# Patient Record
Sex: Female | Born: 1980
Health system: Southern US, Community
[De-identification: ages and names within clinical notes are randomized; demographics above are authoritative.]

## PROBLEM LIST (undated history)

## (undated) ENCOUNTER — Inpatient Hospital Stay (HOSPITAL_COMMUNITY): Payer: Self-pay

## (undated) DIAGNOSIS — Z8619 Personal history of other infectious and parasitic diseases: Secondary | ICD-10-CM

## (undated) DIAGNOSIS — G250 Essential tremor: Secondary | ICD-10-CM

## (undated) DIAGNOSIS — N920 Excessive and frequent menstruation with regular cycle: Secondary | ICD-10-CM

## (undated) DIAGNOSIS — B373 Candidiasis of vulva and vagina: Secondary | ICD-10-CM

## (undated) DIAGNOSIS — Z87440 Personal history of urinary (tract) infections: Secondary | ICD-10-CM

## (undated) DIAGNOSIS — D649 Anemia, unspecified: Secondary | ICD-10-CM

## (undated) DIAGNOSIS — L709 Acne, unspecified: Secondary | ICD-10-CM

## (undated) DIAGNOSIS — B3731 Acute candidiasis of vulva and vagina: Secondary | ICD-10-CM

## (undated) DIAGNOSIS — Z87898 Personal history of other specified conditions: Secondary | ICD-10-CM

## (undated) HISTORY — PX: COSMETIC SURGERY: SHX468

## (undated) HISTORY — DX: Acne, unspecified: L70.9

## (undated) HISTORY — DX: Personal history of urinary (tract) infections: Z87.440

## (undated) HISTORY — DX: Excessive and frequent menstruation with regular cycle: N92.0

## (undated) HISTORY — PX: LAPAROSCOPIC UNILATERAL SALPINGO OOPHERECTOMY: SHX5935

## (undated) HISTORY — DX: Acute candidiasis of vulva and vagina: B37.31

## (undated) HISTORY — PX: BREAST SURGERY: SHX581

## (undated) HISTORY — PX: APPENDECTOMY: SHX54

## (undated) HISTORY — PX: WISDOM TOOTH EXTRACTION: SHX21

## (undated) HISTORY — PX: DILATION AND EVACUATION: SHX1459

## (undated) HISTORY — DX: Candidiasis of vulva and vagina: B37.3

## (undated) HISTORY — DX: Anemia, unspecified: D64.9

## (undated) HISTORY — DX: Personal history of other infectious and parasitic diseases: Z86.19

## (undated) HISTORY — DX: Essential tremor: G25.0

## (undated) HISTORY — DX: Personal history of other specified conditions: Z87.898

---
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
## 2002-01-03 ENCOUNTER — Inpatient Hospital Stay (HOSPITAL_COMMUNITY): Admission: AD | Admit: 2002-01-03 | Discharge: 2002-01-03 | Payer: Self-pay | Admitting: *Deleted

## 2002-01-03 ENCOUNTER — Encounter: Payer: Self-pay | Admitting: Family Medicine

## 2002-02-09 ENCOUNTER — Encounter (INDEPENDENT_AMBULATORY_CARE_PROVIDER_SITE_OTHER): Payer: Self-pay

## 2002-02-09 ENCOUNTER — Inpatient Hospital Stay (HOSPITAL_COMMUNITY): Admission: AD | Admit: 2002-02-09 | Discharge: 2002-02-10 | Payer: Self-pay | Admitting: *Deleted

## 2002-05-23 ENCOUNTER — Inpatient Hospital Stay (HOSPITAL_COMMUNITY): Admission: AD | Admit: 2002-05-23 | Discharge: 2002-05-23 | Payer: Self-pay | Admitting: Family Medicine

## 2002-09-21 ENCOUNTER — Emergency Department (HOSPITAL_COMMUNITY): Admission: EM | Admit: 2002-09-21 | Discharge: 2002-09-21 | Payer: Self-pay

## 2002-09-22 ENCOUNTER — Emergency Department (HOSPITAL_COMMUNITY): Admission: EM | Admit: 2002-09-22 | Discharge: 2002-09-22 | Payer: Self-pay | Admitting: *Deleted

## 2003-10-30 ENCOUNTER — Inpatient Hospital Stay (HOSPITAL_COMMUNITY): Admission: AD | Admit: 2003-10-30 | Discharge: 2003-10-30 | Payer: Self-pay | Admitting: Obstetrics and Gynecology

## 2004-07-29 ENCOUNTER — Inpatient Hospital Stay (HOSPITAL_COMMUNITY): Admission: AD | Admit: 2004-07-29 | Discharge: 2004-07-30 | Payer: Self-pay | Admitting: Obstetrics & Gynecology

## 2005-01-14 ENCOUNTER — Inpatient Hospital Stay (HOSPITAL_COMMUNITY): Admission: AD | Admit: 2005-01-14 | Discharge: 2005-01-14 | Payer: Self-pay | Admitting: Obstetrics & Gynecology

## 2005-01-25 ENCOUNTER — Ambulatory Visit (HOSPITAL_COMMUNITY): Admission: RE | Admit: 2005-01-25 | Discharge: 2005-01-25 | Payer: Self-pay | Admitting: Obstetrics & Gynecology

## 2005-02-20 ENCOUNTER — Emergency Department (HOSPITAL_COMMUNITY): Admission: EM | Admit: 2005-02-20 | Discharge: 2005-02-20 | Payer: Self-pay | Admitting: Emergency Medicine

## 2005-05-24 ENCOUNTER — Emergency Department (HOSPITAL_COMMUNITY): Admission: EM | Admit: 2005-05-24 | Discharge: 2005-05-24 | Payer: Self-pay | Admitting: Family Medicine

## 2006-06-14 ENCOUNTER — Ambulatory Visit (HOSPITAL_COMMUNITY): Admission: RE | Admit: 2006-06-14 | Discharge: 2006-06-14 | Payer: Self-pay | Admitting: Obstetrics and Gynecology

## 2006-07-16 ENCOUNTER — Emergency Department (HOSPITAL_COMMUNITY): Admission: EM | Admit: 2006-07-16 | Discharge: 2006-07-17 | Payer: Self-pay | Admitting: Emergency Medicine

## 2007-06-09 ENCOUNTER — Inpatient Hospital Stay (HOSPITAL_COMMUNITY): Admission: AD | Admit: 2007-06-09 | Discharge: 2007-06-09 | Payer: Self-pay | Admitting: Obstetrics & Gynecology

## 2007-09-02 IMAGING — US US OB COMP LESS 14 WK
1 series · 13 of 28 positions shown · non-contrast
Comparison: none

CLINICAL DATA: 24-year-old female with positive pregnancy.  5 week EGA by LMP.  
 OBSTETRICAL ULTRASOUND <14 WKS AND TRANSVAGINAL OB US:
TECHNIQUE: Both transabdominal and transvaginal ultrasound examinations were performed for complete evaluation of the gestation as well as the maternal uterus, adnexal regions, and pelvic cul-de-sac.

[Series 1: us ob comp less 14 wk · 0.29mm/px · 13 of 63 slices shown]
[im 3/63]
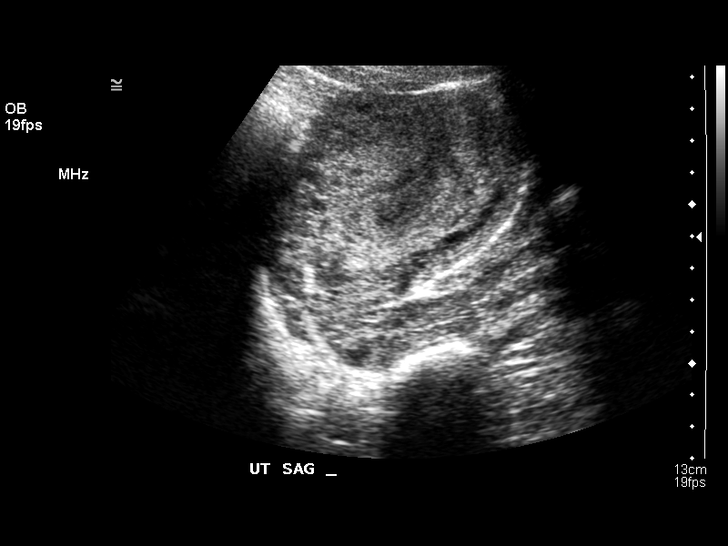
[im 7/63]
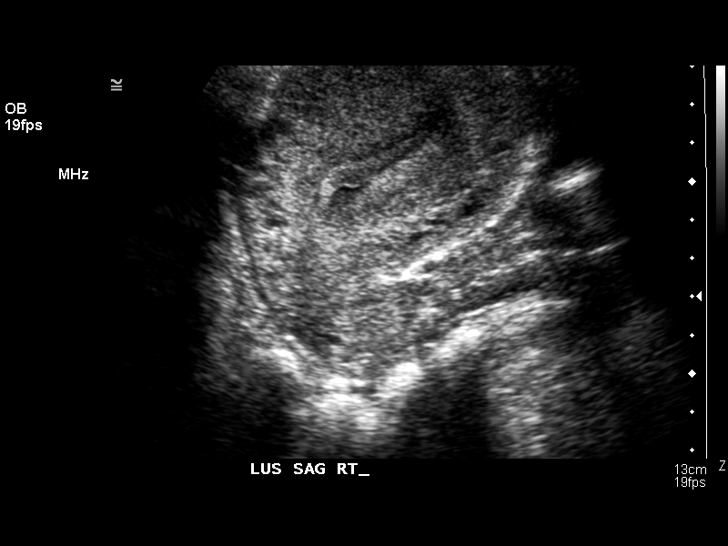
[im 12/63]
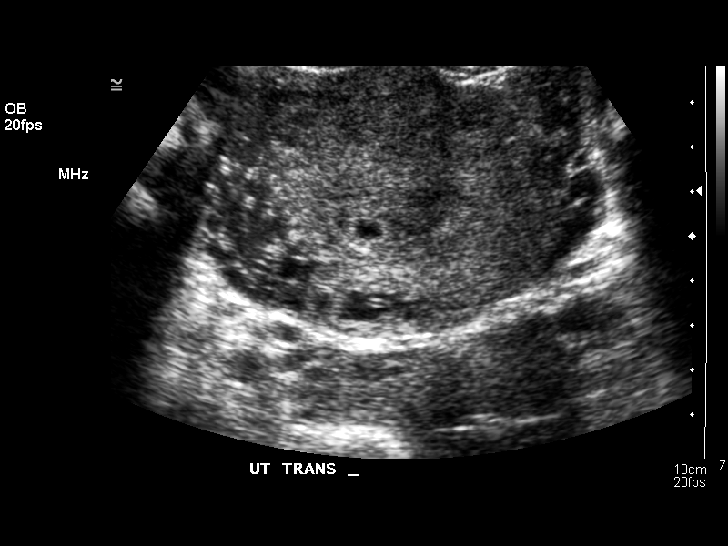
[im 17/63]
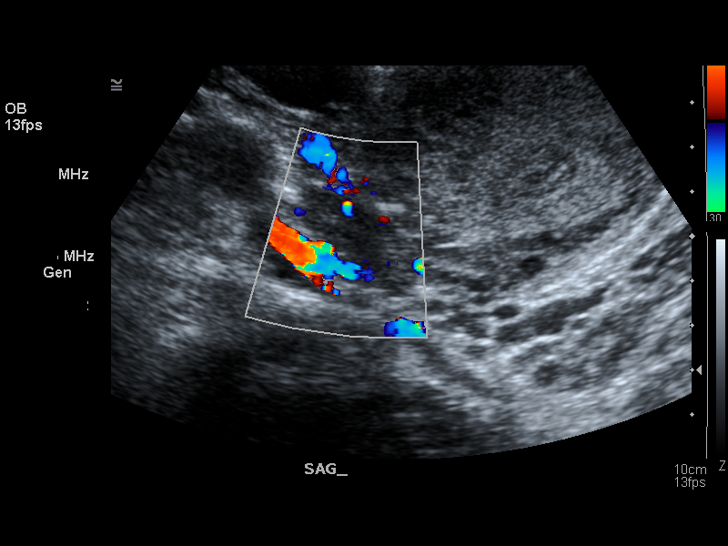
[im 21/63]
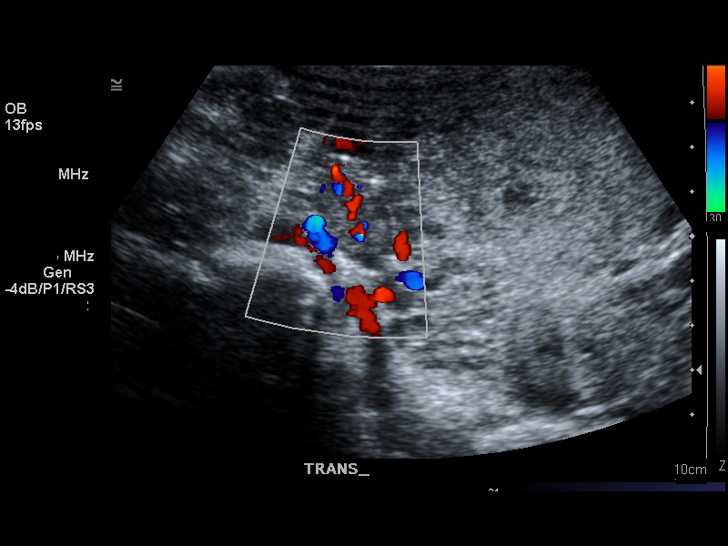
[im 26/63]
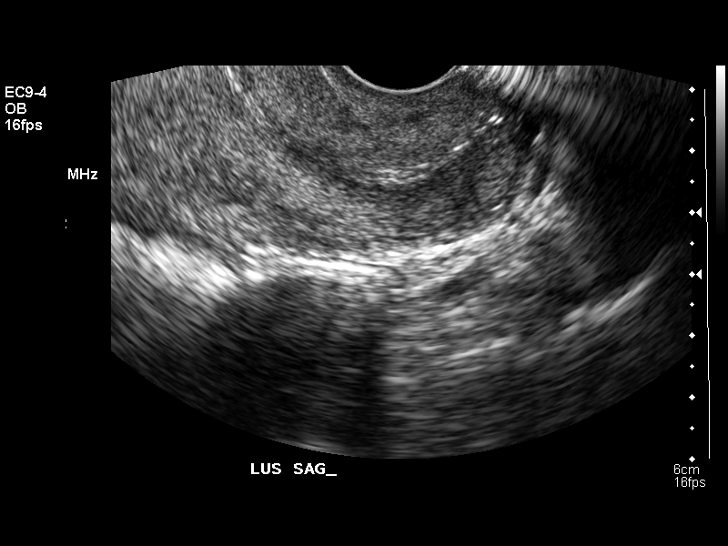
[im 33/63]
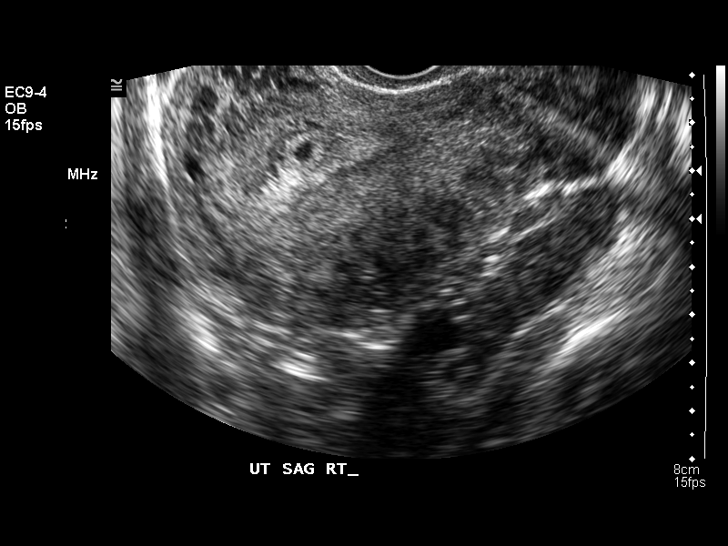
[im 37/63]
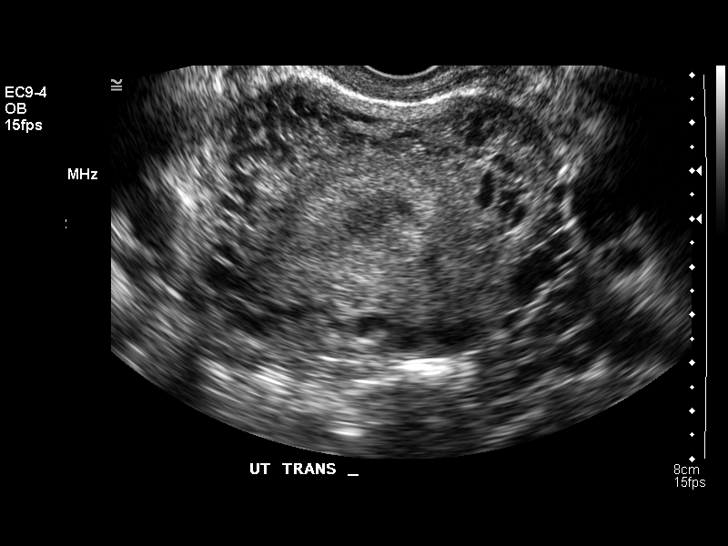
[im 42/63]
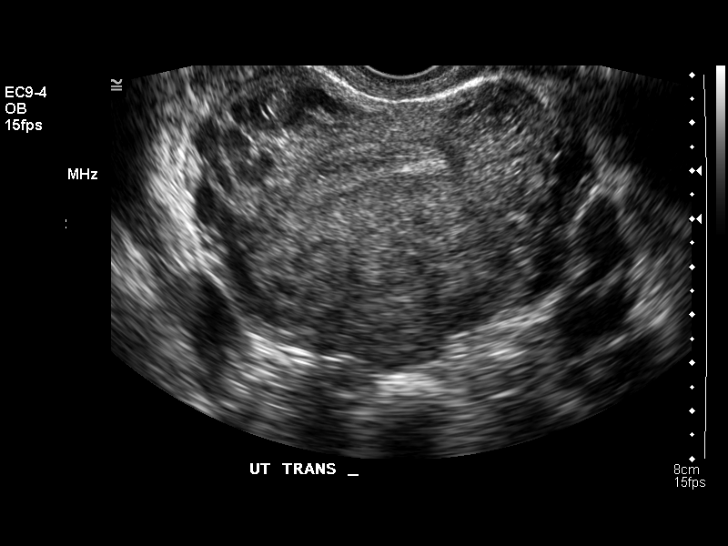
[im 46/63]
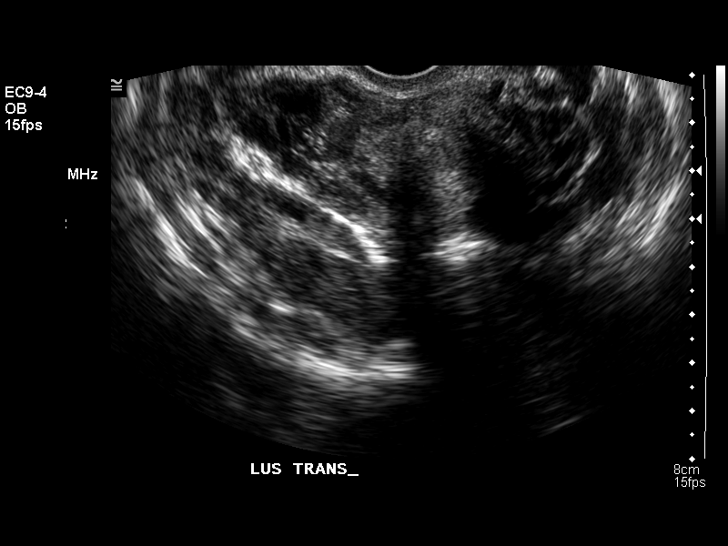
[im 51/63]
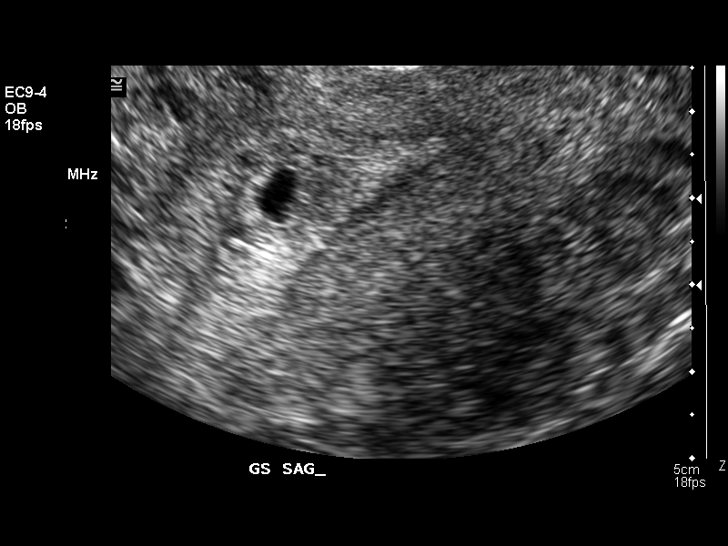
[im 56/63]
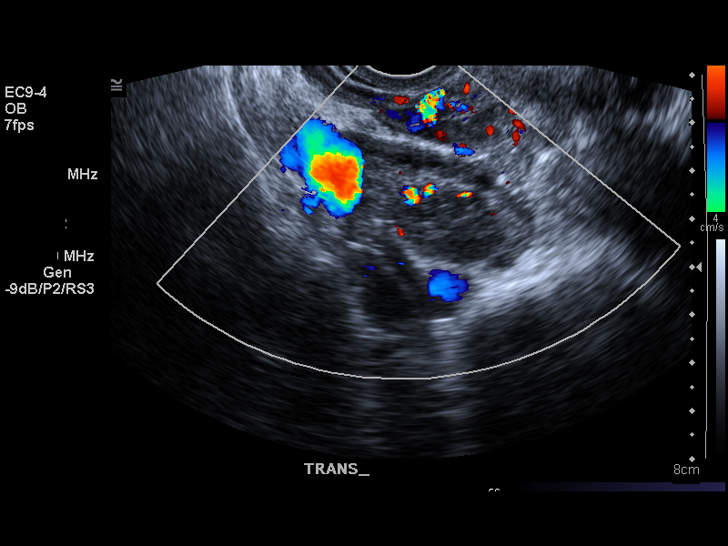
[im 60/63]
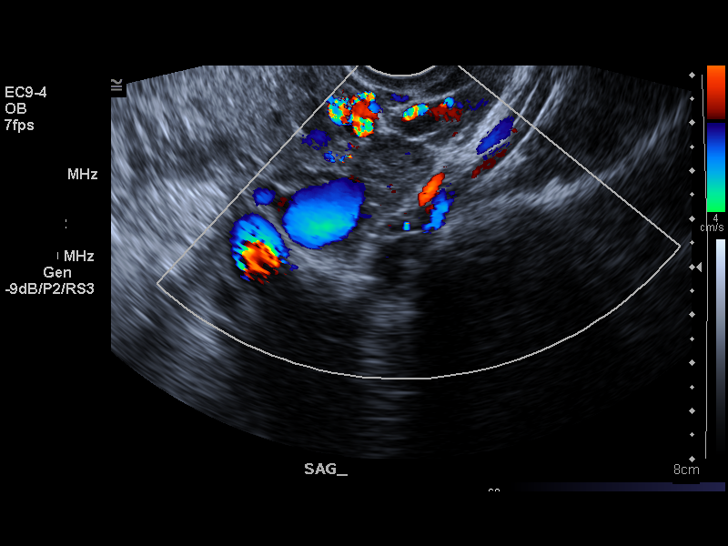

[13 of 28 positions shown; findings below may reference images not displayed]

FINDINGS: There is a single intrauterine cystic structure with mild surrounding echogenicity likely representing small gestational sac.  This mean diameter of this structure measures 4.8 mm corresponding to a gestational age of 5 weeks 2 days.  However, there is no evidence of yolk sac or fetal pole visualized at this time.  No evidence of chronic hemorrhage.  Left ovary is not visualized, but the right ovary is unremarkable.  There is no evidence of free fluid or adnexal masses.
IMPRESSION: 1.  Single intrauterine cystic structure likely representing a gestational sac, which would measures 5 weeks 2 days.  However, as there is no evidence of yolk sac or fetal pole at this time, cannot definitely confirm this is a gestational sac and recommend clinical or imaging follow-up as ectopic is not excluded.
 2.  No evidence of free fluid or adnexal masses.

## 2007-09-08 ENCOUNTER — Inpatient Hospital Stay (HOSPITAL_COMMUNITY): Admission: AD | Admit: 2007-09-08 | Discharge: 2007-09-08 | Payer: Self-pay | Admitting: Obstetrics and Gynecology

## 2007-09-20 ENCOUNTER — Emergency Department (HOSPITAL_COMMUNITY): Admission: EM | Admit: 2007-09-20 | Discharge: 2007-09-20 | Payer: Self-pay | Admitting: Family Medicine

## 2007-10-28 ENCOUNTER — Emergency Department (HOSPITAL_COMMUNITY): Admission: EM | Admit: 2007-10-28 | Discharge: 2007-10-28 | Payer: Self-pay | Admitting: Emergency Medicine

## 2008-02-18 ENCOUNTER — Inpatient Hospital Stay (HOSPITAL_COMMUNITY): Admission: AD | Admit: 2008-02-18 | Discharge: 2008-02-18 | Payer: Self-pay | Admitting: Obstetrics & Gynecology

## 2008-03-07 ENCOUNTER — Emergency Department (HOSPITAL_COMMUNITY): Admission: EM | Admit: 2008-03-07 | Discharge: 2008-03-07 | Payer: Self-pay | Admitting: Emergency Medicine

## 2008-09-03 ENCOUNTER — Encounter: Admission: RE | Admit: 2008-09-03 | Discharge: 2008-09-03 | Payer: Self-pay | Admitting: Internal Medicine

## 2009-11-20 ENCOUNTER — Emergency Department (HOSPITAL_COMMUNITY): Admission: EM | Admit: 2009-11-20 | Discharge: 2009-11-20 | Payer: Self-pay | Admitting: Family Medicine

## 2010-02-04 ENCOUNTER — Emergency Department (HOSPITAL_COMMUNITY)
Admission: EM | Admit: 2010-02-04 | Discharge: 2010-02-04 | Payer: Self-pay | Source: Home / Self Care | Admitting: Family Medicine

## 2010-03-28 ENCOUNTER — Inpatient Hospital Stay (INDEPENDENT_AMBULATORY_CARE_PROVIDER_SITE_OTHER)
Admission: RE | Admit: 2010-03-28 | Discharge: 2010-03-28 | Disposition: A | Discharge status: Home / Self Care | Payer: PRIVATE HEALTH INSURANCE | Source: Ambulatory Visit | Attending: Family Medicine | Admitting: Family Medicine

## 2010-03-28 DIAGNOSIS — I951 Orthostatic hypotension: Secondary | ICD-10-CM

## 2010-03-28 LAB — POCT URINALYSIS DIPSTICK
Nitrite: POSITIVE — AB
Protein, ur: >=300 mg/dL — AB
Urobilinogen, UA: 2 mg/dL — ABNORMAL HIGH (ref 0.0–1.0)
pH: 7 (ref 5.0–8.0)

## 2010-03-28 LAB — POCT I-STAT, CHEM 8
Calcium, Ion: 1.15 mmol/L (ref 1.12–1.32)
Chloride: 106 mEq/L (ref 96–112)
Glucose, Bld: 89 mg/dL (ref 70–99)
HCT: 41 % (ref 36.0–46.0)
TCO2: 22 mmol/L (ref 0–100)

## 2010-03-30 ENCOUNTER — Emergency Department (HOSPITAL_COMMUNITY): Payer: PRIVATE HEALTH INSURANCE

## 2010-03-30 ENCOUNTER — Emergency Department (HOSPITAL_COMMUNITY)
Admission: EM | Admit: 2010-03-30 | Discharge: 2010-03-30 | Disposition: A | Discharge status: Home / Self Care | Payer: PRIVATE HEALTH INSURANCE | Attending: Emergency Medicine | Admitting: Emergency Medicine

## 2010-03-30 DIAGNOSIS — R51 Headache: Secondary | ICD-10-CM | POA: Insufficient documentation

## 2010-03-30 DIAGNOSIS — R0602 Shortness of breath: Secondary | ICD-10-CM | POA: Insufficient documentation

## 2010-03-30 DIAGNOSIS — R42 Dizziness and giddiness: Secondary | ICD-10-CM | POA: Insufficient documentation

## 2010-03-30 LAB — URINALYSIS, ROUTINE W REFLEX MICROSCOPIC
Leukocytes, UA: NEGATIVE
Nitrite: NEGATIVE
Specific Gravity, Urine: 1.013 (ref 1.005–1.030)
pH: 7.5 (ref 5.0–8.0)

## 2010-03-30 LAB — DIFFERENTIAL
Basophils Absolute: 0 10*3/uL (ref 0.0–0.1)
Eosinophils Absolute: 0.1 10*3/uL (ref 0.0–0.7)
Eosinophils Relative: 1 % (ref 0–5)
Lymphocytes Relative: 20 % (ref 12–46)
Monocytes Absolute: 0.5 10*3/uL (ref 0.1–1.0)

## 2010-03-30 LAB — COMPREHENSIVE METABOLIC PANEL
CO2: 26 mEq/L (ref 19–32)
Calcium: 9.1 mg/dL (ref 8.4–10.5)
Creatinine, Ser: 0.8 mg/dL (ref 0.4–1.2)
GFR calc non Af Amer: >60 mL/min (ref >60)
Glucose, Bld: 86 mg/dL (ref 70–99)

## 2010-03-30 LAB — CBC
HCT: 38.5 % (ref 36.0–46.0)
MCHC: 34 g/dL (ref 30.0–36.0)
MCV: 94.1 fL (ref 78.0–100.0)
Platelets: 228 10*3/uL (ref 150–400)
RDW: 13.1 % (ref 11.5–15.5)

## 2010-03-30 LAB — URINE MICROSCOPIC-ADD ON

## 2010-05-02 LAB — POCT URINALYSIS DIPSTICK
Ketones, ur: NEGATIVE mg/dL
Nitrite: POSITIVE — AB
Protein, ur: 30 mg/dL — AB
pH: 6 (ref 5.0–8.0)

## 2010-05-02 LAB — URINE CULTURE: Colony Count: >=100000

## 2010-05-05 LAB — WET PREP, GENITAL: Yeast Wet Prep HPF POC: NONE SEEN

## 2010-05-05 LAB — POCT URINALYSIS DIPSTICK
Glucose, UA: NEGATIVE mg/dL
Specific Gravity, Urine: >=1.03 (ref 1.005–1.030)
Urobilinogen, UA: 1 mg/dL (ref 0.0–1.0)

## 2010-05-05 LAB — POCT PREGNANCY, URINE: Preg Test, Ur: NEGATIVE

## 2010-07-07 ENCOUNTER — Inpatient Hospital Stay (INDEPENDENT_AMBULATORY_CARE_PROVIDER_SITE_OTHER)
Admission: RE | Admit: 2010-07-07 | Discharge: 2010-07-07 | Disposition: A | Discharge status: Home / Self Care | Payer: PRIVATE HEALTH INSURANCE | Source: Ambulatory Visit | Attending: Family Medicine | Admitting: Family Medicine

## 2010-07-07 ENCOUNTER — Inpatient Hospital Stay (HOSPITAL_COMMUNITY)
Admission: RE | Admit: 2010-07-07 | Discharge: 2010-07-07 | Disposition: A | Discharge status: Home / Self Care | Payer: PRIVATE HEALTH INSURANCE | Source: Ambulatory Visit | Attending: Family Medicine | Admitting: Family Medicine

## 2010-07-07 DIAGNOSIS — B373 Candidiasis of vulva and vagina: Secondary | ICD-10-CM

## 2010-07-07 DIAGNOSIS — B3731 Acute candidiasis of vulva and vagina: Secondary | ICD-10-CM

## 2010-07-07 LAB — POCT URINALYSIS DIP (DEVICE)
Protein, ur: 30 mg/dL — AB
Specific Gravity, Urine: 1.025 (ref 1.005–1.030)
pH: 6 (ref 5.0–8.0)

## 2010-07-07 LAB — POCT PREGNANCY, URINE: Preg Test, Ur: NEGATIVE

## 2010-07-08 NOTE — Discharge Summary (Signed)
NAME:  Carolyn Pierce, Carolyn Pierce                         ACCOUNT NO.:  [id number]   MEDICAL RECORD NO.:  [id number]                   PATIENT TYPE:  INP   LOCATION:  9138                                 FACILITY:  [facility]   PHYSICIAN:  Conni Elliot, M.D.             DATE OF BIRTH:  [birth year]   DATE OF ADMISSION:  02/09/2002  DATE OF DISCHARGE:  02/10/2002                                 DISCHARGE SUMMARY   PROCEDURE:  None.   CONSULTS:  1. Social work.  2. Pastoral care.   DISCHARGE DIAGNOSES:  1. Intrauterine pregnancy at approximately [redacted] weeks gestation.  2. Fetal demise.  3. Preterm labor.  4. Preterm delivery.  5. Anemia, acute blood loss.  6. O- blood type.   DISCHARGE MEDICATIONS:  1. Ibuprofen 600 mg one p.o. q.6h. p.r.n. pain.  2. Ortho-Evra patch apply one patch to skin every week.  3. Flintstone vitamins one tablet q.d.  4. Iron sulfate 325 mg one p.o. b.i.d. x3 months.   FOLLOW UP:  The patient is recommended to follow up at [facility] in the  next six weeks.   HOSPITAL COURSE:  The patient is a 30 year old G4, P1-0-2-1 presenting at  approximately [redacted] weeks gestation with no prenatal care and presenting in  active labor.  The patient's cervix is completely dilated with a bulging bag  of membranes and infant was determined to be breech presentation by  ultrasound.  Secondary to advanced dilatation and preterm labor, infant was  delivered spontaneously by spontaneous vaginal delivery.  Delivery of a female  infant with Apgars 1 at one minute, 1 at five minutes, and 1 at 10 minutes.  NICU team was present at delivery.  Infant was initially intubated, however,  heart rate fell to 50s despite resuscitation measures.  Infant was unable to  be resuscitated by the NICU team present.  Infant was pronounced dead,  wrapped in warm blankets, and handed to mother and father.  The patient did  well in the postoperative period without any complications.  The patient met  with  pastoral care as well as social work during her hospitalization in  regards to funeral arrangements as well as appropriate grieving process.  The patient was provided with contact information and pamphlets in regards  to grieving and reactions to loss of such.  The patient was also counseled  extensively to future grieving potential physical changes including milk  production in the upcoming days.  The patient received RhoGAM prior to  discharge secondary to O- status and unknown infant blood type.  The patient  desired [facility]inks for contraception.   DISCHARGE LABORATORIES:  Kauer blood cells 12.5, hemoglobin 7.8, hematocrit  23.0, platelets 285,000.  RPR is nonreactive.   DISCHARGE INSTRUCTIONS:  Activity:  No restrictions.  Diet:  Routine.  Instructions:  The patient is to return to [facility] for development  of fever, severe abdominal pain, vomiting, or  abdominal distention.     Nilda Simmer, M.D.                        Conni Elliot, M.D.    KS/MEDQ  D:  02/10/2002  T:  02/11/2002  Job:  [id number]

## 2010-11-15 LAB — GC/CHLAMYDIA PROBE AMP, GENITAL
Chlamydia, DNA Probe: NEGATIVE
GC Probe Amp, Genital: NEGATIVE

## 2010-11-15 LAB — WET PREP, GENITAL: Clue Cells Wet Prep HPF POC: NONE SEEN

## 2010-11-18 LAB — WET PREP, GENITAL: Yeast Wet Prep HPF POC: NONE SEEN

## 2010-11-18 LAB — GC/CHLAMYDIA PROBE AMP, GENITAL: GC Probe Amp, Genital: NEGATIVE

## 2011-04-23 ENCOUNTER — Encounter (HOSPITAL_COMMUNITY): Payer: Self-pay | Admitting: *Deleted

## 2011-04-23 ENCOUNTER — Emergency Department (HOSPITAL_COMMUNITY)
Admission: EM | Admit: 2011-04-23 | Discharge: 2011-04-23 | Disposition: A | Discharge status: Home / Self Care | Payer: Medicaid Other | Attending: Emergency Medicine | Admitting: Emergency Medicine

## 2011-04-23 DIAGNOSIS — K5289 Other specified noninfective gastroenteritis and colitis: Secondary | ICD-10-CM | POA: Insufficient documentation

## 2011-04-23 DIAGNOSIS — K529 Noninfective gastroenteritis and colitis, unspecified: Secondary | ICD-10-CM

## 2011-04-23 DIAGNOSIS — IMO0001 Reserved for inherently not codable concepts without codable children: Secondary | ICD-10-CM | POA: Insufficient documentation

## 2011-04-23 DIAGNOSIS — R000 Tachycardia, unspecified: Secondary | ICD-10-CM | POA: Insufficient documentation

## 2011-04-23 DIAGNOSIS — A599 Trichomoniasis, unspecified: Secondary | ICD-10-CM

## 2011-04-23 DIAGNOSIS — R197 Diarrhea, unspecified: Secondary | ICD-10-CM | POA: Insufficient documentation

## 2011-04-23 DIAGNOSIS — R1110 Vomiting, unspecified: Secondary | ICD-10-CM | POA: Insufficient documentation

## 2011-04-23 LAB — URINALYSIS, ROUTINE W REFLEX MICROSCOPIC
Bilirubin Urine: NEGATIVE
Hgb urine dipstick: NEGATIVE
Ketones, ur: NEGATIVE mg/dL
Protein, ur: 30 mg/dL — AB
Urobilinogen, UA: 1 mg/dL (ref 0.0–1.0)

## 2011-04-23 LAB — URINE MICROSCOPIC-ADD ON

## 2011-04-23 LAB — POCT I-STAT, CHEM 8
Chloride: 103 mEq/L (ref 96–112)
HCT: 44 % (ref 36.0–46.0)
Hemoglobin: 15 g/dL (ref 12.0–15.0)
Potassium: 4.1 mEq/L (ref 3.5–5.1)
Sodium: 139 mEq/L (ref 135–145)

## 2011-04-23 LAB — POCT PREGNANCY, URINE: Preg Test, Ur: NEGATIVE

## 2011-04-23 MED ORDER — SODIUM CHLORIDE 0.9 % IV SOLN
Freq: Once | INTRAVENOUS | Status: AC
  Administered 2011-04-23: 10:00:00 via INTRAVENOUS

## 2011-04-23 MED ORDER — SODIUM CHLORIDE 0.9 % IV BOLUS (SEPSIS)
1000.0000 mL | Freq: Once | INTRAVENOUS | Status: AC
  Administered 2011-04-23: 1000 mL via INTRAVENOUS

## 2011-04-23 MED ORDER — METRONIDAZOLE 500 MG PO TABS: 500.0000 mg | ORAL_TABLET | Freq: Two times a day (BID) | ORAL | Status: AC

## 2011-04-23 NOTE — ED Notes (Signed)
Pt from home with reports of body aches, vomiting and diarrhea that started on Thursday, improved but returned this morning at 0500.

## 2011-04-23 NOTE — ED Provider Notes (Signed)
History     CSN: [id number]  Arrival date & time 04/23/11  0814   First MD Initiated Contact with Patient 04/23/11 0831      Chief Complaint  Patient presents with  . Generalized Body Aches  . Emesis  . Diarrhea    (Consider location/radiation/quality/duration/timing/severity/associated sxs/prior treatment) Patient is a 31 y.o. female presenting with vomiting and diarrhea. The history is provided by the patient.  Emesis  This is a new problem. The current episode started more than 2 days ago. The problem occurs 2 to 4 times per day. The problem has not changed since onset.There has been no fever. Associated symptoms include diarrhea. Pertinent negatives include no abdominal pain, no chills, no cough, no fever, no headaches and no URI.  Diarrhea The primary symptoms include vomiting and diarrhea. Primary symptoms do not include fever or abdominal pain.  The illness does not include chills.   No meds take pta--nothing makes sx better or worse History reviewed. No pertinent past medical history.  Past Surgical History  Procedure Date  . Appendectomy     History reviewed. No pertinent family history.  History  Substance Use Topics  . Smoking status: Never Smoker   . Smokeless tobacco: Never Used  . Alcohol Use: Yes     occ    OB History    Grav Para Term Preterm Abortions TAB SAB Ect Mult Living                  Review of Systems  Constitutional: Negative for fever and chills.  Respiratory: Negative for cough.   Gastrointestinal: Positive for vomiting and diarrhea. Negative for abdominal pain.  Neurological: Negative for headaches.  All other systems reviewed and are negative.    Allergies  Review of patient's allergies indicates no known allergies.  Home Medications   Current Outpatient Rx  Name Route Sig Dispense Refill  . FERROUS SULFATE 325 (65 FE) MG PO TABS Oral Take 325 mg by mouth daily with breakfast.    . NORGESTIM-ETH ESTRAD TRIPHASIC  0.18/0.215/0.25 MG-25 MCG PO TABS Oral Take 1 tablet by mouth daily.    Marland Kitchen PROPRANOLOL HCL 20 MG PO TABS Oral Take 20 mg by mouth daily.      BP 115/71  Pulse 110  Temp(Src) 98.7 F (37.1 C) (Oral)  Resp 18  Wt 120 lb (54.432 kg)  SpO2 100%  LMP 04/10/2011  Physical Exam  Nursing note and vitals reviewed. Constitutional: She is oriented to person, place, and time. She appears well-developed and well-nourished.  Non-toxic appearance. No distress.  HENT:  Head: Normocephalic and atraumatic.  Eyes: Conjunctivae, EOM and lids are normal. Pupils are equal, round, and reactive to light.  Neck: Normal range of motion. Neck supple. No tracheal deviation present. No mass present.  Cardiovascular: Regular rhythm and normal heart sounds.  Tachycardia present.  Exam reveals no gallop.   No murmur heard. Pulmonary/Chest: Effort normal and breath sounds normal. No stridor. No respiratory distress. She has no decreased breath sounds. She has no wheezes. She has no rhonchi. She has no rales.  Abdominal: Soft. Normal appearance and bowel sounds are normal. She exhibits no distension. There is no tenderness. There is no rigidity, no rebound, no guarding and no CVA tenderness.  Musculoskeletal: Normal range of motion. She exhibits no edema and no tenderness.  Neurological: She is alert and oriented to person, place, and time. She has normal strength. No cranial nerve deficit or sensory deficit. GCS eye subscore is 4.  GCS verbal subscore is 5. GCS motor subscore is 6.  Skin: Skin is warm and dry. No abrasion and no rash noted.  Psychiatric: She has a normal mood and affect. Her speech is normal and behavior is normal.    ED Course  Procedures (including critical care time)  Labs Reviewed - No data to display No results found.   No diagnosis found.    MDM  Patient given IV fluids and anti-medics and feels better. We'll treat patient for her Trichomonas        Toy Baker, MD 04/23/11  1039

## 2011-04-24 LAB — URINE CULTURE

## 2011-10-30 ENCOUNTER — Emergency Department (HOSPITAL_COMMUNITY)
Admission: EM | Admit: 2011-10-30 | Discharge: 2011-10-30 | Disposition: A | Discharge status: Home / Self Care | Payer: Medicaid Other | Source: Home / Self Care | Attending: Family Medicine | Admitting: Family Medicine

## 2011-10-30 ENCOUNTER — Encounter (HOSPITAL_COMMUNITY): Payer: Self-pay | Admitting: Emergency Medicine

## 2011-10-30 DIAGNOSIS — A499 Bacterial infection, unspecified: Secondary | ICD-10-CM

## 2011-10-30 DIAGNOSIS — N760 Acute vaginitis: Secondary | ICD-10-CM

## 2011-10-30 DIAGNOSIS — B9689 Other specified bacterial agents as the cause of diseases classified elsewhere: Secondary | ICD-10-CM

## 2011-10-30 LAB — POCT URINALYSIS DIP (DEVICE)
Leukocytes, UA: NEGATIVE
Protein, ur: NEGATIVE mg/dL
Urobilinogen, UA: 2 mg/dL — ABNORMAL HIGH (ref 0.0–1.0)
pH: 6 (ref 5.0–8.0)

## 2011-10-30 LAB — WET PREP, GENITAL

## 2011-10-30 LAB — POCT PREGNANCY, URINE: Preg Test, Ur: NEGATIVE

## 2011-10-30 MED ORDER — METRONIDAZOLE 0.75 % VA GEL: 1.0000 | VAGINAL | Status: AC

## 2011-10-30 NOTE — ED Notes (Signed)
Pt having increased thick Cornman ordorous discharge for 3 days. States she has no pain or itching, no frequency or dysuria.

## 2011-10-30 NOTE — ED Provider Notes (Signed)
History     CSN: [id number]  Arrival date & time 10/30/11  1740   First MD Initiated Contact with Patient 10/30/11 1744      Chief Complaint  Patient presents with  . Vaginal Discharge    (Consider location/radiation/quality/duration/timing/severity/associated sxs/prior treatment) Patient is a 31 y.o. female presenting with vaginal discharge. The history is provided by the patient.  Vaginal Discharge This is a new problem. The current episode started more than 2 days ago. The problem has not changed since onset.Associated symptoms comments: Vaginal odor is primary issue.    History reviewed. No pertinent past medical history.  Past Surgical History  Procedure Date  . Appendectomy     History reviewed. No pertinent family history.  History  Substance Use Topics  . Smoking status: Never Smoker   . Smokeless tobacco: Never Used  . Alcohol Use: Yes     occ    OB History    Grav Para Term Preterm Abortions TAB SAB Ect Mult Living                  Review of Systems  Constitutional: Negative.   Gastrointestinal: Negative.   Genitourinary: Positive for vaginal discharge. Negative for vaginal bleeding and vaginal pain.  Musculoskeletal: Negative.     Allergies  Review of patient's allergies indicates no known allergies.  Home Medications   Current Outpatient Rx  Name Route Sig Dispense Refill  . FERROUS SULFATE 325 (65 FE) MG PO TABS Oral Take 325 mg by mouth daily with breakfast.    . METRONIDAZOLE 0.75 % VA GEL Vaginal Place 1 Applicatorful vaginally 1 day or 1 dose. Use nightly for 5-7 nights 70 g 0  . NORGESTIM-ETH ESTRAD TRIPHASIC 0.18/0.215/0.25 MG-25 MCG PO TABS Oral Take 1 tablet by mouth daily.    Marland Kitchen PROPRANOLOL HCL 20 MG PO TABS Oral Take 20 mg by mouth daily.      BP 114/75  Pulse 70  Temp 99 F (37.2 C) (Oral)  Resp 18  SpO2 100%  LMP 10/16/2011  Physical Exam  Nursing note and vitals reviewed. Constitutional: She is oriented to person, place,  and time. She appears well-developed and well-nourished.  Abdominal: Soft. Bowel sounds are normal. She exhibits no mass. There is no tenderness.  Genitourinary: Uterus normal. No erythema, tenderness or bleeding around the vagina. No foreign body around the vagina. Vaginal discharge found.  Neurological: She is alert and oriented to person, place, and time.  Skin: Skin is warm and dry.    ED Course  Procedures (including critical care time)  Labs Reviewed  POCT URINALYSIS DIP (DEVICE) - Abnormal; Notable for the following:    Hgb urine dipstick TRACE (*)     Urobilinogen, UA 2.0 (*)     All other components within normal limits  POCT PREGNANCY, URINE  WET PREP, GENITAL  GC/CHLAMYDIA PROBE AMP, GENITAL   No results found.   1. Bacterial vaginosis       MDM          Linna Hoff, MD 10/30/11 1905

## 2011-10-31 LAB — GC/CHLAMYDIA PROBE AMP, GENITAL: Chlamydia, DNA Probe: NEGATIVE

## 2011-10-31 NOTE — ED Notes (Signed)
Gc/Chlamydia neg., Wet prep: mod. clue cells, few WBC's.  Pt. adequately treated with Metrogel. Carolyn Pierce 10/31/2011

## 2011-12-28 ENCOUNTER — Telehealth: Payer: Self-pay | Admitting: Family Medicine

## 2012-01-03 NOTE — Telephone Encounter (Signed)
FYI

## 2012-01-04 ENCOUNTER — Encounter: Payer: Self-pay | Admitting: Medical

## 2012-01-04 ENCOUNTER — Ambulatory Visit (INDEPENDENT_AMBULATORY_CARE_PROVIDER_SITE_OTHER): Payer: 59 | Admitting: Medical

## 2012-01-04 VITALS — BP 100/80 | HR 80 | Temp 98.5°F | Resp 16 | Ht 63.0 in | Wt 129.0 lb

## 2012-01-04 DIAGNOSIS — D649 Anemia, unspecified: Secondary | ICD-10-CM

## 2012-01-04 DIAGNOSIS — L708 Other acne: Secondary | ICD-10-CM

## 2012-01-04 DIAGNOSIS — Z8742 Personal history of other diseases of the female genital tract: Secondary | ICD-10-CM

## 2012-01-04 DIAGNOSIS — L709 Acne, unspecified: Secondary | ICD-10-CM

## 2012-01-04 DIAGNOSIS — Z87898 Personal history of other specified conditions: Secondary | ICD-10-CM

## 2012-01-04 DIAGNOSIS — Z0000 Encounter for general adult medical examination without abnormal findings: Secondary | ICD-10-CM

## 2012-01-04 LAB — POCT URINALYSIS DIPSTICK
Bilirubin, UA: NEGATIVE
Nitrite, UA: NEGATIVE
Urobilinogen, UA: NEGATIVE
pH, UA: 6

## 2012-01-04 MED ORDER — CLINDAMYCIN PHOS-BENZOYL PEROX 1-5 % EX GEL: Freq: Two times a day (BID) | CUTANEOUS | Status: DC

## 2012-01-04 NOTE — Progress Notes (Signed)
Subjective:   HPI  Carolyn Pierce is a 31 y.o. female who presents for a complete physical.  New patient today.     Preventative care: Last ophthalmology visit:n/a Last dental visit:yes, recently Last colonoscopy:n/a Last mammogram: years ago normal, done due to aching in breast Last gynecological exam:? 2013 including pap Last EKG:n/a Last labs:08/2011  Prior vaccinations: TD or Tdap:3 to 4 years ago Influenza:yes at work Pneumococcal:n/a Shingles/Zostavax:n/a  Advanced directive:n/a Health care power of attorney:n/a Living will:n/a  Concerns: Acne of face, upper back, upper chest, mild to moderate at times.   Hx/o heavy periods, hx/o iron deficiency anemia.    Hx/o recurrent yeast infections, but this has improved since begging probiotic.  Reviewed their medical, surgical, family, social, medication, and allergy history and updated chart as appropriate.   Past Medical History  Diagnosis Date  . Acne   . Essential tremor     Dr. Terrace Arabia, neurology  . History of abnormal Pap smear   . History of UTI   . History of trichomonal vaginitis   . Menorrhagia     prior trials of Depo Provera, Ortho Tri Cyclen Lo  . Anemia     hx/o iron deficiency anemia  . Yeast vaginitis     recurrent    Past Surgical History  Procedure Date  . Appendectomy   . Wisdom tooth extraction     History reviewed. No pertinent family history.  History   Social History  . Marital Status: Single    Spouse Name: N/A    Number of Children: N/A  . Years of Education: N/A   Occupational History  . Not on file.   Social History Main Topics  . Smoking status: Never Smoker   . Smokeless tobacco: Never Used  . Alcohol Use: Yes     Comment: occ  . Drug Use: No  . Sexually Active: Yes    Birth Control/ Protection: Condom   Other Topics Concern  . Not on file   Social History Narrative   Single, son 21 years old, psychiatric nurse, exercises 3-4 days per week, 90 minutes with  running, weights    Current Outpatient Prescriptions on File Prior to Visit  Medication Sig Dispense Refill  . propranolol (INDERAL) 20 MG tablet Take 20 mg by mouth daily.        No Known Allergies   Review of Systems Constitutional: -fever, -chills, -sweats, -unexpected weight change, -decreased appetite, +fatigue Allergy: -sneezing, -itching, -congestion Dermatology: -changing moles, --rash, -lumps ENT: -runny nose, -ear pain, -sore throat, -hoarseness, -sinus pain, -teeth pain, - ringing in ears, -hearing loss, -nosebleeds Cardiology: -chest pain, -palpitations, -swelling, -difficulty breathing when lying flat, -waking up short of breath Respiratory: -cough, -shortness of breath, -difficulty breathing with exercise or exertion, -wheezing, -coughing up blood Gastroenterology: -abdominal pain, -nausea, -vomiting, -diarrhea, -constipation, -blood in stool, -changes in bowel movement, -difficulty swallowing or eating Hematology: -bleeding, -bruising  Musculoskeletal: -joint aches, -muscle aches, -joint swelling, -back pain, -neck pain, -cramping, -changes in gait Ophthalmology: denies vision changes, eye redness, itching, discharge Urology: -burning with urination, -difficulty urinating, -blood in urine, +urinary frequency, -urgency, -incontinence Neurology: -headache, -weakness, -tingling, -numbness, -memory loss, -falls, -dizziness Psychology: -depressed mood, -agitation, -sleep problems     Objective:   Physical Exam  Filed Vitals:   01/04/12 1435  Height: 5\' 3"  (1.6 m)  Weight: 129 lb (58.514 kg)    General appearance: alert, no distress, WD/WN, lean AA female Skin: mild to moderate acne of face and upper  chest HEENT: normocephalic, conjunctiva/corneas normal, sclerae anicteric, PERRLA, EOMi, nares patent, no discharge or erythema, pharynx normal Oral cavity: MMM, tongue normal, teeth in good repair Neck: supple, no lymphadenopathy, no thyromegaly, no masses, normal  ROM Chest: non tender, normal shape and expansion Heart: RRR, normal S1, S2, no murmurs Lungs: CTA bilaterally, no wheezes, rhonchi, or rales Abdomen: +bs, soft, RLQ surgical scar, striae of abdomen, tattoo of umbilicus, non tender, non distended, no masses, no hepatomegaly, no splenomegaly, no bruits Back: non tender, normal ROM, no scoliosis Musculoskeletal: upper extremities non tender, no obvious deformity, normal ROM throughout, lower extremities non tender, no obvious deformity, normal ROM throughout Extremities: no edema, no cyanosis, no clubbing Pulses: 2+ symmetric, upper and lower extremities, normal cap refill Neurological: alert, oriented x 3, CN2-12 intact, strength normal upper extremities and lower extremities, sensation normal throughout, DTRs 2+ throughout, no cerebellar signs, gait normal Psychiatric: normal affect, behavior normal, pleasant  Breast/gyn/rectal - deferred    Assessment and Plan :    Encounter Diagnoses  Name Primary?  . Routine general medical examination at a health care facility Yes  . Acne   . Anemia   . History of abnormal Pap smear     Physical exam - discussed healthy lifestyle, diet, exercise, preventative care, vaccinations, and addressed their concerns.  Handout given.  Acne - begin benzaclin, daily face wash, recheck 64mo.  Advised daily MV with iron.  We will request prior pap and lab records.  No labs today.   Follow-up pending labs

## 2012-01-05 ENCOUNTER — Encounter: Payer: Self-pay | Admitting: Medical

## 2012-01-08 ENCOUNTER — Encounter: Payer: Self-pay | Admitting: Medical

## 2012-01-25 ENCOUNTER — Telehealth: Payer: Self-pay | Admitting: Internal Medicine

## 2012-01-26 NOTE — Telephone Encounter (Signed)
pls pull chart to see if we ever got prior pap records. I'll need an updated negative pregnancy test too.

## 2012-01-30 NOTE — Telephone Encounter (Signed)
We don't have any old records on this patient. CLS

## 2012-01-30 NOTE — Telephone Encounter (Signed)
Patient's chart is on your desk. CLS 

## 2012-01-30 NOTE — Telephone Encounter (Signed)
pls pull this chart again.  i accidentally put it back.  Look to see if we got pap results in from former doctor

## 2012-01-30 NOTE — Telephone Encounter (Signed)
So far I haven't received prior records, labs, pap.  Make sure we sent request for records.

## 2012-02-01 ENCOUNTER — Telehealth: Payer: Self-pay | Admitting: Medical

## 2012-02-02 NOTE — Telephone Encounter (Signed)
1) she apparently didn't sign records release.  Pls have her come in to sign records release request so I cant get last pap results.  I dont' have them 2) when she comes have her do urine pregnancy.  If negative, then I'll send the OCP.   Is she currently taking them?  If not, when was last pill?  What was the name of the one she was using?

## 2012-02-09 NOTE — Telephone Encounter (Signed)
I have tried to get in touch with this patient but no return phone yet. CLS

## 2012-08-22 ENCOUNTER — Encounter (HOSPITAL_COMMUNITY): Payer: Self-pay | Admitting: Emergency Medicine

## 2012-08-22 ENCOUNTER — Emergency Department (HOSPITAL_COMMUNITY)
Admission: EM | Admit: 2012-08-22 | Discharge: 2012-08-22 | Disposition: A | Discharge status: Home / Self Care | Payer: 59 | Source: Home / Self Care

## 2012-08-22 DIAGNOSIS — M67919 Unspecified disorder of synovium and tendon, unspecified shoulder: Secondary | ICD-10-CM

## 2012-08-22 DIAGNOSIS — M7551 Bursitis of right shoulder: Secondary | ICD-10-CM

## 2012-08-22 LAB — POCT URINALYSIS DIP (DEVICE)
Leukocytes, UA: NEGATIVE
Nitrite: NEGATIVE
Protein, ur: NEGATIVE mg/dL
Urobilinogen, UA: 0.2 mg/dL (ref 0.0–1.0)
pH: 6.5 (ref 5.0–8.0)

## 2012-08-22 LAB — POCT PREGNANCY, URINE: Preg Test, Ur: NEGATIVE

## 2012-08-22 MED ORDER — TRIAMCINOLONE ACETONIDE 40 MG/ML IJ SUSP
INTRAMUSCULAR | Status: AC
  Filled 2012-08-22: qty 5

## 2012-08-22 NOTE — ED Provider Notes (Signed)
Medical screening examination/treatment/procedure(s) were performed by non-physician practitioner and as supervising physician I was immediately available for consultation/collaboration.  Leslee Home, M.D.  Reuben Likes, MD 08/22/12 [phone]

## 2012-08-22 NOTE — ED Provider Notes (Signed)
History    CSN: [id number] Arrival date & time 08/22/12  1117  None    Chief Complaint  Patient presents with  . Shoulder Pain   (Consider location/radiation/quality/duration/timing/severity/associated sxs/prior Treatment) HPI Comments: 32 year old female presents with pain in the right upper arm to proximal deltoid. She is complaining of pain with abduction. She has been having the pain for a couple months is gradually getting worse. She is unaware of any known trauma. No known injury. She does work at a computer quite a bit. Patient is also complaining of occasional urinary frequency and sensation of the need to urinate again after urination but without the ability to release additional urine. There is no dysuria no pelvic or suprapubic pain.   Patient is a 32 y.o. female presenting with shoulder pain.  Shoulder Pain   Past Medical History  Diagnosis Date  . Acne   . Essential tremor     Dr. Terrace Arabia, neurology  . History of abnormal Pap smear   . History of UTI   . History of trichomonal vaginitis   . Menorrhagia     prior trials of Depo Provera, Ortho Tri Cyclen Lo  . Anemia     hx/o iron deficiency anemia  . Yeast vaginitis     recurrent   Past Surgical History  Procedure Laterality Date  . Appendectomy    . Wisdom tooth extraction     No family history on file. History  Substance Use Topics  . Smoking status: Never Smoker   . Smokeless tobacco: Never Used  . Alcohol Use: Yes     Comment: occ   OB History   Grav Para Term Preterm Abortions TAB SAB Ect Mult Living                 Review of Systems  Constitutional: Negative for fever, chills and activity change.  HENT: Negative.   Respiratory: Negative.   Cardiovascular: Negative.   Musculoskeletal:       As per HPI  Skin: Negative for color change, pallor and rash.  Neurological: Negative.     Allergies  Review of patient's allergies indicates no known allergies.  Home Medications   Current  Outpatient Rx  Name  Route  Sig  Dispense  Refill  . propranolol (INDERAL) 20 MG tablet   Oral   Take 20 mg by mouth daily.         . clindamycin-benzoyl peroxide (BENZACLIN) gel   Topical   Apply topically 2 (two) times daily.   25 g   0    BP 104/70  Pulse 74  Temp(Src) 99 F (37.2 C) (Oral)  Resp 16  SpO2 99%  LMP 08/09/2012 Physical Exam  Nursing note and vitals reviewed. Constitutional: She is oriented to person, place, and time. She appears well-developed and well-nourished. No distress.  HENT:  Head: Normocephalic and atraumatic.  Eyes: EOM are normal. Pupils are equal, round, and reactive to light.  Neck: Normal range of motion. Neck supple.  Pulmonary/Chest: Effort normal.  Musculoskeletal:  Area pain and tenderness difficult to locate with palpation when the arm is hanging by the side. Upon abduction beyond 90 there is tenderness over the most proximal aspect of the deltoid ligament. This point is just distal to the a.c. joint. There is no swelling, deformity or discoloration. She is able to abduct to approximately 140.  Lymphadenopathy:    She has no cervical adenopathy.  Neurological: She is alert and oriented to person, place, and time.  No cranial nerve deficit.  Skin: Skin is warm and dry.  Psychiatric: She has a normal mood and affect.    ED Course  Injection tendon or ligament Date/Time: 08/22/2012 1:00 PM Performed by: Phineas Real, Hien Perreira Authorized by: Phineas Real, Jamieon Lannen Consent: Verbal consent obtained. Risks and benefits: risks, benefits and alternatives were discussed Consent given by: patient Patient understanding: patient states understanding of the procedure being performed Patient identity confirmed: verbally with patient Local anesthesia used: yes Anesthesia: local infiltration Local anesthetic: bupivacaine 0.25% with epinephrine Anesthetic total: 2.5 ml Patient sedated: no Patient tolerance: Patient tolerated the procedure well with no immediate  complications. Comments: The trigger point was located in the mid proximal deltoid just inferior to superior glenoid. A total of 2.5 cc of Kenalog and 2.5 cc of Marcaine was injected into the site at various angles.   (including critical care time) Labs Reviewed  POCT URINALYSIS DIP (DEVICE) - Abnormal; Notable for the following:    Hgb urine dipstick MODERATE (*)    All other components within normal limits  POCT PREGNANCY, URINE   No results found. 1. Bursitis/tendonitis, shoulder, right     MDM  Injected trigger point the right performed over the proximal deltoid tendon with Kenalog and Marcaine. She may apply heat today to help with Circulation of the medication but may want to change to ice the following day. Instructions for bursitis, deltoid ligament pain and muscles of the rotator cuff. For continuation of pain and pain with abduction followup with orthopedist. Urinalysis is negative. Patient is instructed that if she develops dysuria, digital frequency, urgency or other problems may return.   Hayden Rasmussen, NP 08/22/12 1317

## 2012-08-22 NOTE — ED Notes (Signed)
Pt c/o right arm/shoulder pain onset x6 weeks... Pain started on the arm and it worked its way up to right shoulder... Denies: inj/trauma, strenuous activity... Pain increases w/activity... Also c/o poss UTI onset 1 week... sxs include: urinary freq/urgency... Denies: fevers, vag d/c, abd pain, dysuria... She is alert w/no signs of acute distress.

## 2012-12-03 ENCOUNTER — Telehealth: Payer: Self-pay | Admitting: *Deleted

## 2012-12-03 NOTE — Telephone Encounter (Signed)
Left message for patient to call the office

## 2012-12-20 ENCOUNTER — Ambulatory Visit: Payer: Self-pay | Admitting: Neurology

## 2012-12-26 ENCOUNTER — Ambulatory Visit (INDEPENDENT_AMBULATORY_CARE_PROVIDER_SITE_OTHER): Payer: 59 | Admitting: Neurology

## 2012-12-26 ENCOUNTER — Encounter: Payer: Self-pay | Admitting: Neurology

## 2012-12-26 VITALS — BP 132/71 | HR 82 | Ht 61.0 in | Wt 132.0 lb

## 2012-12-26 DIAGNOSIS — R259 Unspecified abnormal involuntary movements: Secondary | ICD-10-CM

## 2012-12-26 DIAGNOSIS — R251 Tremor, unspecified: Secondary | ICD-10-CM | POA: Insufficient documentation

## 2012-12-26 DIAGNOSIS — F419 Anxiety disorder, unspecified: Secondary | ICD-10-CM | POA: Insufficient documentation

## 2012-12-26 DIAGNOSIS — F411 Generalized anxiety disorder: Secondary | ICD-10-CM

## 2012-12-26 MED ORDER — PROPRANOLOL HCL 20 MG PO TABS: 20.0000 mg | ORAL_TABLET | Freq: Every day | ORAL | Status: DC

## 2012-12-26 MED ORDER — BUSPIRONE HCL 5 MG PO TABS: 5.0000 mg | ORAL_TABLET | Freq: Three times a day (TID) | ORAL | Status: DC

## 2012-12-26 NOTE — Progress Notes (Signed)
[facility]  PATIENT: Carolyn Pierce DOB: 12/18/1980  HISTORICAL:  Elease Hashimoto is a 32 years old right-handed African American female, she is referred by her primary care physician for evaluation of tremor, last clinical visit was in July 2012,  She has a strong family history of tremor, her father, her sister, even her 24 years old son has bilateral hands tremor,  She noticed bilateral hands tremor since middle school, most noticeable when she writing, holding a piece of paper, but never cause any functional limitations, she graduated from nursing school, currently working as a Loss adjuster, chartered, she noticed her hand tremor when she perform injection, causing mild difficulty, getting worse when she was anxious, she suffered a mild anxiety disorder as well, she is taking propanolol 20 mg twice a day, which helps her tremor, she also wants a prescription of Buspirone for her anxiety  REVIEW OF SYSTEMS: Full 14 system review of systems performed and notable only for tremor, anxiety ALLERGIES: No Known Allergies  HOME MEDICATIONS: Outpatient Prescriptions Prior to Visit  Medication Sig Dispense Refill  . clindamycin-benzoyl peroxide (BENZACLIN) gel Apply topically 2 (two) times daily.  25 g  0  . propranolol (INDERAL) 20 MG tablet Take 20 mg by mouth daily.       No facility-administered medications prior to visit.    PAST MEDICAL HISTORY: Past Medical History  Diagnosis Date  . Acne   . Essential tremor     Dr. Terrace Arabia, neurology  . History of abnormal Pap smear   . History of UTI   . History of trichomonal vaginitis   . Menorrhagia     prior trials of Depo Provera, Ortho Tri Cyclen Lo  . Anemia     hx/o iron deficiency anemia  . Yeast vaginitis     recurrent    PAST SURGICAL HISTORY: Past Surgical History  Procedure Laterality Date  . Appendectomy    . Wisdom tooth extraction      FAMILY HISTORY: History reviewed. No pertinent family  history.  SOCIAL HISTORY:  History   Social History  . Marital Status: Single    Spouse Name: N/A    Number of Children: N/A  . Years of Education: N/A   Occupational History  . Not on file.   Social History Main Topics  . Smoking status: Never Smoker   . Smokeless tobacco: Never Used  . Alcohol Use: Yes     Comment: occ  . Drug Use: No  . Sexual Activity: Yes    Birth Control/ Protection: Condom   Other Topics Concern  . Not on file   Social History Narrative   Single, son 24 years old, psychiatric nurse, exercises 3-4 days per week, 90 minutes with running, weights     PHYSICAL EXAM   Filed Vitals:   12/26/12 1108  BP: 132/71  Pulse: 82  Height: 5\' 1"  (1.549 m)  Weight: 132 lb (59.875 kg)   Body mass index is 24.95 kg/(m^2).   Generalized: In no acute distress  Neck: Supple, no carotid bruits   Cardiac: Regular rate rhythm  Pulmonary: Clear to auscultation bilaterally  Musculoskeletal: No deformity  Neurological examination  Mentation: Alert oriented to time, place, history taking, and causual conversation  Cranial nerve II-XII: Pupils were equal round reactive to light extraocular movements were full, visual field were full on confrontational test. facial sensation and strength were normal. hearing was intact to finger rubbing bilaterally. Uvula tongue midline.  head turning and shoulder  shrug and were normal and symmetric.Tongue protrusion into cheek strength was normal.  Motor: normal tone, bulk and strength, with exception of mild bilateral hand postural tremor, no rigidity  Sensory: Intact to fine touch, pinprick, preserved vibratory sensation, and proprioception at toes.  Coordination: Normal finger to nose, heel-to-shin bilaterally there was no truncal ataxia  Gait: Rising up from seated position without assistance, normal stance, without trunk ataxia, moderate stride, good arm swing, smooth turning, able to perform tiptoe, and heel walking  without difficulty.   Romberg signs: Negative  Deep tendon reflexes: Brachioradialis 2/2, biceps 2/2, triceps 2/2, patellar 2/2, Achilles 2/2, plantar responses were flexor bilaterally.   DIAGNOSTIC DATA (LABS, IMAGING, TESTING) - I reviewed patient records, labs, notes, testing and imaging myself where available.  Lab Results  Component Value Date   WBC 7.5 03/30/2010   HGB 15.0 04/23/2011   HCT 44.0 04/23/2011   MCV 94.1 03/30/2010   PLT 228 03/30/2010      Component Value Date/Time   NA 139 04/23/2011 0910   K 4.1 04/23/2011 0910   CL 103 04/23/2011 0910   CO2 26 03/30/2010 1334   GLUCOSE 91 04/23/2011 0910   BUN 16 04/23/2011 0910   CREATININE 0.70 04/23/2011 0910   CALCIUM 9.1 03/30/2010 1334   PROT 7.1 03/30/2010 1334   ALBUMIN 3.7 03/30/2010 1334   AST 18 03/30/2010 1334   ALT 12 03/30/2010 1334   ALKPHOS 34* 03/30/2010 1334   BILITOT 1.1 03/30/2010 1334   GFRNONAA >60 03/30/2010 1334   GFRAA  Value: >60        The eGFR has been calculated using the MDRD equation. This calculation has not been validated in all clinical situations. eGFR's persistently <60 mL/min signify possible Chronic Kidney Disease. 03/30/2010 1334    ASSESSMENT AND PLAN   32 years old right-handed African American female, presenting with bilateral hands tremor, she has strong family history of tremor, responding well to propranolol  1. I will refill her propranolol 20 mg twice a day, 2. She also complains of anxiety, will add on Buspirone 5mg  qday. 3. RTC in one year. Check TSH   Levert Feinstein, M.D. Ph.D.  [facility] [address] [place], [place] [address] [phone]

## 2012-12-27 LAB — THYROID PANEL WITH TSH
Free Thyroxine Index: 2.3 (ref 1.2–4.9)
TSH: 1.33 u[IU]/mL (ref 0.450–4.500)

## 2012-12-30 NOTE — Progress Notes (Signed)
Quick Note:  Spoke to patient and relayed normal labs, per Dr. Yan. ______ 

## 2013-10-01 ENCOUNTER — Emergency Department (HOSPITAL_COMMUNITY)
Admission: EM | Admit: 2013-10-01 | Discharge: 2013-10-01 | Discharge status: Against Medical Advice | Payer: Medicaid Other | Source: Home / Self Care | Attending: Emergency Medicine | Admitting: Emergency Medicine

## 2013-10-01 ENCOUNTER — Encounter (HOSPITAL_COMMUNITY): Payer: Self-pay | Admitting: Emergency Medicine

## 2013-10-01 DIAGNOSIS — R209 Unspecified disturbances of skin sensation: Secondary | ICD-10-CM | POA: Insufficient documentation

## 2013-10-01 DIAGNOSIS — R109 Unspecified abdominal pain: Secondary | ICD-10-CM | POA: Insufficient documentation

## 2013-10-01 NOTE — ED Notes (Signed)
Pt had a D & E done today and now having severe pain and spotting, pt currently yelling in room in pain.

## 2013-10-01 NOTE — ED Notes (Signed)
Triage done from downtime form 

## 2013-10-01 NOTE — ED Notes (Signed)
Pt walking out AMA, when RN came out of another room pt and boyfriend were walking out.

## 2013-10-01 NOTE — ED Notes (Signed)
Pt yelling in room, stating "this is not normal, I need to see someone now, my leg is numb", pt standing on side of bed w/o difficulty, pt states having severe pain.

## 2013-10-04 ENCOUNTER — Inpatient Hospital Stay (HOSPITAL_COMMUNITY): Payer: Medicaid Other | Admitting: Anesthesiology

## 2013-10-04 ENCOUNTER — Inpatient Hospital Stay (HOSPITAL_COMMUNITY): Payer: Medicaid Other

## 2013-10-04 ENCOUNTER — Encounter (HOSPITAL_COMMUNITY): Payer: Self-pay | Admitting: Emergency Medicine

## 2013-10-04 ENCOUNTER — Encounter (HOSPITAL_COMMUNITY): Payer: Self-pay | Admitting: *Deleted

## 2013-10-04 ENCOUNTER — Encounter (HOSPITAL_COMMUNITY): Payer: Medicaid Other | Admitting: Anesthesiology

## 2013-10-04 ENCOUNTER — Emergency Department (INDEPENDENT_AMBULATORY_CARE_PROVIDER_SITE_OTHER)
Admission: EM | Admit: 2013-10-04 | Discharge: 2013-10-04 | Disposition: A | Discharge status: Nursing Home (Includes ICF) | Payer: Medicaid Other | Source: Home / Self Care | Attending: Family Medicine | Admitting: Family Medicine

## 2013-10-04 ENCOUNTER — Encounter (HOSPITAL_COMMUNITY)
Admission: AD | Disposition: A | Discharge status: Home / Self Care | Payer: Self-pay | Source: Ambulatory Visit | Attending: Obstetrics and Gynecology

## 2013-10-04 ENCOUNTER — Ambulatory Visit (HOSPITAL_COMMUNITY)
Admit: 2013-10-04 | Discharge: 2013-10-04 | Disposition: A | Discharge status: Home / Self Care | Payer: Medicaid Other | Attending: Obstetrics and Gynecology | Admitting: Obstetrics and Gynecology

## 2013-10-04 ENCOUNTER — Inpatient Hospital Stay (HOSPITAL_COMMUNITY)
Admission: AD | Admit: 2013-10-04 | Discharge: 2013-10-05 | DRG: 743 | Disposition: A | Discharge status: Home / Self Care | Payer: Medicaid Other | Source: Ambulatory Visit | Attending: Obstetrics and Gynecology | Admitting: Obstetrics and Gynecology

## 2013-10-04 DIAGNOSIS — D72829 Elevated white blood cell count, unspecified: Secondary | ICD-10-CM | POA: Diagnosis present

## 2013-10-04 DIAGNOSIS — N8353 Torsion of ovary, ovarian pedicle and fallopian tube: Principal | ICD-10-CM | POA: Diagnosis present

## 2013-10-04 DIAGNOSIS — N83519 Torsion of ovary and ovarian pedicle, unspecified side: Secondary | ICD-10-CM | POA: Diagnosis present

## 2013-10-04 DIAGNOSIS — N739 Female pelvic inflammatory disease, unspecified: Secondary | ICD-10-CM | POA: Diagnosis present

## 2013-10-04 DIAGNOSIS — Z5331 Laparoscopic surgical procedure converted to open procedure: Secondary | ICD-10-CM | POA: Diagnosis not present

## 2013-10-04 DIAGNOSIS — R109 Unspecified abdominal pain: Secondary | ICD-10-CM

## 2013-10-04 DIAGNOSIS — D649 Anemia, unspecified: Secondary | ICD-10-CM | POA: Diagnosis present

## 2013-10-04 DIAGNOSIS — N949 Unspecified condition associated with female genital organs and menstrual cycle: Secondary | ICD-10-CM | POA: Diagnosis present

## 2013-10-04 HISTORY — PX: LAPAROSCOPY: SHX197

## 2013-10-04 LAB — URINE MICROSCOPIC-ADD ON

## 2013-10-04 LAB — COMPREHENSIVE METABOLIC PANEL
ALBUMIN: 4 g/dL (ref 3.5–5.2)
ALK PHOS: 76 U/L (ref 39–117)
ALT: 8 U/L (ref 0–35)
AST: 17 U/L (ref 0–37)
Anion gap: 16 — ABNORMAL HIGH (ref 5–15)
BILIRUBIN TOTAL: 1.1 mg/dL (ref 0.3–1.2)
BUN: 33 mg/dL — ABNORMAL HIGH (ref 6–23)
CHLORIDE: 94 meq/L — AB (ref 96–112)
CO2: 26 meq/L (ref 19–32)
CREATININE: 1.26 mg/dL — AB (ref 0.50–1.10)
Calcium: 9.5 mg/dL (ref 8.4–10.5)
GFR calc Af Amer: 65 mL/min — ABNORMAL LOW (ref >90)
GFR, EST NON AFRICAN AMERICAN: 56 mL/min — AB (ref >90)
Glucose, Bld: 130 mg/dL — ABNORMAL HIGH (ref 70–99)
POTASSIUM: 3.6 meq/L — AB (ref 3.7–5.3)
SODIUM: 136 meq/L — AB (ref 137–147)
Total Protein: 8.1 g/dL (ref 6.0–8.3)

## 2013-10-04 LAB — HCG, QUANTITATIVE, PREGNANCY: HCG, BETA CHAIN, QUANT, S: 6684 m[IU]/mL — AB (ref <5)

## 2013-10-04 LAB — CBC
HCT: 31.2 % — ABNORMAL LOW (ref 36.0–46.0)
Hemoglobin: 10.5 g/dL — ABNORMAL LOW (ref 12.0–15.0)
MCH: 28.2 pg (ref 26.0–34.0)
MCHC: 33.7 g/dL (ref 30.0–36.0)
MCV: 83.6 fL (ref 78.0–100.0)
PLATELETS: 415 10*3/uL — AB (ref 150–400)
RBC: 3.73 MIL/uL — AB (ref 3.87–5.11)
RDW: 15.4 % (ref 11.5–15.5)
WBC: 30.2 10*3/uL — AB (ref 4.0–10.5)

## 2013-10-04 LAB — DIFFERENTIAL
BASOS ABS: 0 10*3/uL (ref 0.0–0.1)
BASOS PCT: 0 % (ref 0–1)
EOS PCT: 0 % (ref 0–5)
Eosinophils Absolute: 0 10*3/uL (ref 0.0–0.7)
LYMPHS PCT: 5 % — AB (ref 12–46)
Lymphs Abs: 1.4 10*3/uL (ref 0.7–4.0)
MONO ABS: 2.7 10*3/uL — AB (ref 0.1–1.0)
Monocytes Relative: 9 % (ref 3–12)
NEUTROS ABS: 25.2 10*3/uL — AB (ref 1.7–7.7)
Neutrophils Relative %: 86 % — ABNORMAL HIGH (ref 43–77)

## 2013-10-04 LAB — URINALYSIS, ROUTINE W REFLEX MICROSCOPIC
Glucose, UA: NEGATIVE mg/dL
Ketones, ur: 15 mg/dL — AB
LEUKOCYTES UA: NEGATIVE
NITRITE: NEGATIVE
PROTEIN: 100 mg/dL — AB
UROBILINOGEN UA: 1 mg/dL (ref 0.0–1.0)
pH: 6 (ref 5.0–8.0)

## 2013-10-04 LAB — MRSA PCR SCREENING: MRSA BY PCR: NEGATIVE

## 2013-10-04 SURGERY — LAPAROSCOPY OPERATIVE
Anesthesia: General | Site: Abdomen

## 2013-10-04 MED ORDER — PIPERACILLIN-TAZOBACTAM 3.375 G IVPB 30 MIN
3.3750 g | Freq: Three times a day (TID) | INTRAVENOUS | Status: DC
  Administered 2013-10-04: 3.375 g via INTRAVENOUS
  Filled 2013-10-04 (×2): qty 50

## 2013-10-04 MED ORDER — MIDAZOLAM HCL 5 MG/5ML IJ SOLN
INTRAMUSCULAR | Status: DC | PRN
  Administered 2013-10-04 (×2): 1 mg via INTRAVENOUS

## 2013-10-04 MED ORDER — BUPIVACAINE HCL (PF) 0.25 % IJ SOLN
INTRAMUSCULAR | Status: AC
  Filled 2013-10-04: qty 30

## 2013-10-04 MED ORDER — LIDOCAINE HCL (CARDIAC) 20 MG/ML IV SOLN
INTRAVENOUS | Status: AC
  Filled 2013-10-04: qty 5

## 2013-10-04 MED ORDER — LACTATED RINGERS IV SOLN
INTRAVENOUS | Status: DC
  Administered 2013-10-04 – 2013-10-05 (×4): via INTRAVENOUS

## 2013-10-04 MED ORDER — PROMETHAZINE HCL 25 MG/ML IJ SOLN
12.5000 mg | Freq: Once | INTRAMUSCULAR | Status: AC
  Administered 2013-10-04: 12.5 mg via INTRAVENOUS
  Filled 2013-10-04: qty 1

## 2013-10-04 MED ORDER — PROPOFOL INFUSION 10 MG/ML OPTIME
INTRAVENOUS | Status: DC | PRN
  Administered 2013-10-04: 50 mL via INTRAVENOUS
  Administered 2013-10-04: 20 mL via INTRAVENOUS
  Administered 2013-10-04: 130 mL via INTRAVENOUS

## 2013-10-04 MED ORDER — FENTANYL CITRATE 0.05 MG/ML IJ SOLN
INTRAMUSCULAR | Status: AC
  Filled 2013-10-04: qty 5

## 2013-10-04 MED ORDER — ONDANSETRON HCL 4 MG/2ML IJ SOLN: 4.0000 mg | Freq: Four times a day (QID) | INTRAMUSCULAR | Status: DC | PRN

## 2013-10-04 MED ORDER — PROMETHAZINE HCL 25 MG/ML IJ SOLN
12.5000 mg | Freq: Once | INTRAMUSCULAR | Status: AC
  Administered 2013-10-04: 14:00:00 via INTRAVENOUS
  Filled 2013-10-04: qty 1

## 2013-10-04 MED ORDER — PRENATAL MULTIVITAMIN CH
1.0000 | ORAL_TABLET | Freq: Every day | ORAL | Status: DC
  Filled 2013-10-04: qty 1

## 2013-10-04 MED ORDER — PROPOFOL 10 MG/ML IV EMUL
INTRAVENOUS | Status: AC
  Filled 2013-10-04: qty 20

## 2013-10-04 MED ORDER — ONDANSETRON HCL 4 MG/2ML IJ SOLN
INTRAMUSCULAR | Status: DC | PRN
  Administered 2013-10-04: 4 mg via INTRAVENOUS

## 2013-10-04 MED ORDER — ROCURONIUM BROMIDE 100 MG/10ML IV SOLN
INTRAVENOUS | Status: AC
  Filled 2013-10-04: qty 1

## 2013-10-04 MED ORDER — LIDOCAINE HCL (CARDIAC) 20 MG/ML IV SOLN
INTRAVENOUS | Status: DC | PRN
  Administered 2013-10-04: 60 mg via INTRAVENOUS
  Administered 2013-10-04: 40 mg via INTRAVENOUS

## 2013-10-04 MED ORDER — HYDROMORPHONE HCL PF 1 MG/ML IJ SOLN
0.2000 mg | INTRAMUSCULAR | Status: DC | PRN
  Administered 2013-10-04: 0.6 mg via INTRAVENOUS
  Filled 2013-10-04: qty 1

## 2013-10-04 MED ORDER — SUCCINYLCHOLINE CHLORIDE 20 MG/ML IJ SOLN
INTRAMUSCULAR | Status: AC
  Filled 2013-10-04: qty 10

## 2013-10-04 MED ORDER — ROCURONIUM BROMIDE 100 MG/10ML IV SOLN
INTRAVENOUS | Status: DC | PRN
  Administered 2013-10-04: 5 mg via INTRAVENOUS
  Administered 2013-10-04: 30 mg via INTRAVENOUS

## 2013-10-04 MED ORDER — DEXAMETHASONE SODIUM PHOSPHATE 4 MG/ML IJ SOLN
INTRAMUSCULAR | Status: DC | PRN
  Administered 2013-10-04: 10 mg via INTRAVENOUS

## 2013-10-04 MED ORDER — GADOBENATE DIMEGLUMINE 529 MG/ML IV SOLN: 10.0000 mL | Freq: Once | INTRAVENOUS | Status: AC | PRN

## 2013-10-04 MED ORDER — FENTANYL CITRATE 0.05 MG/ML IJ SOLN
INTRAMUSCULAR | Status: AC
  Filled 2013-10-04: qty 2

## 2013-10-04 MED ORDER — IOHEXOL 300 MG/ML  SOLN
100.0000 mL | Freq: Once | INTRAMUSCULAR | Status: AC | PRN
  Administered 2013-10-04: 100 mL via INTRAVENOUS

## 2013-10-04 MED ORDER — BUPIVACAINE HCL (PF) 0.25 % IJ SOLN
INTRAMUSCULAR | Status: DC | PRN
  Administered 2013-10-04: 7 mL

## 2013-10-04 MED ORDER — MIDAZOLAM HCL 2 MG/2ML IJ SOLN
INTRAMUSCULAR | Status: AC
  Filled 2013-10-04: qty 2

## 2013-10-04 MED ORDER — FENTANYL CITRATE 0.05 MG/ML IJ SOLN
INTRAMUSCULAR | Status: DC | PRN
  Administered 2013-10-04 (×2): 50 ug via INTRAVENOUS
  Administered 2013-10-04: 100 ug via INTRAVENOUS
  Administered 2013-10-04: 150 ug via INTRAVENOUS

## 2013-10-04 MED ORDER — ONDANSETRON HCL 4 MG PO TABS: 4.0000 mg | ORAL_TABLET | Freq: Four times a day (QID) | ORAL | Status: DC | PRN

## 2013-10-04 MED ORDER — ONDANSETRON HCL 4 MG/2ML IJ SOLN
INTRAMUSCULAR | Status: AC
  Filled 2013-10-04: qty 2

## 2013-10-04 MED ORDER — CEFAZOLIN SODIUM-DEXTROSE 2-3 GM-% IV SOLR
INTRAVENOUS | Status: DC | PRN
  Administered 2013-10-04: 2 g via INTRAVENOUS

## 2013-10-04 MED ORDER — LACTATED RINGERS IV BOLUS (SEPSIS)
1000.0000 mL | Freq: Once | INTRAVENOUS | Status: AC
  Administered 2013-10-04: 1000 mL via INTRAVENOUS

## 2013-10-04 MED ORDER — IOHEXOL 300 MG/ML  SOLN
50.0000 mL | INTRAMUSCULAR | Status: AC
  Administered 2013-10-04 (×2): 50 mL via ORAL

## 2013-10-04 MED ORDER — DEXAMETHASONE SODIUM PHOSPHATE 10 MG/ML IJ SOLN
INTRAMUSCULAR | Status: AC
  Filled 2013-10-04: qty 1

## 2013-10-04 SURGICAL SUPPLY — 38 items
ADH SKN CLS APL DERMABOND .7 (GAUZE/BANDAGES/DRESSINGS) ×1
BAG SPEC RTRVL LRG 6X4 10 (ENDOMECHANICALS)
BLADE SURG 11 STRL SS (BLADE) ×3 IMPLANT
CHLORAPREP W/TINT 26ML (MISCELLANEOUS) ×3 IMPLANT
DERMABOND ADVANCED (GAUZE/BANDAGES/DRESSINGS) ×2
DERMABOND ADVANCED .7 DNX12 (GAUZE/BANDAGES/DRESSINGS) ×1 IMPLANT
DRSG COVADERM PLUS 2X2 (GAUZE/BANDAGES/DRESSINGS) ×6 IMPLANT
DRSG OPSITE POSTOP 3X4 (GAUZE/BANDAGES/DRESSINGS) ×4 IMPLANT
DRSG OPSITE POSTOP 4X10 (GAUZE/BANDAGES/DRESSINGS) ×2 IMPLANT
ELECT REM PT RETURN 9FT ADLT (ELECTROSURGICAL)
ELECTRODE REM PT RTRN 9FT ADLT (ELECTROSURGICAL) IMPLANT
GLOVE BIOGEL PI IND STRL 6.5 (GLOVE) ×1 IMPLANT
GLOVE BIOGEL PI IND STRL 7.0 (GLOVE) IMPLANT
GLOVE BIOGEL PI INDICATOR 6.5 (GLOVE) ×2
GLOVE BIOGEL PI INDICATOR 7.0 (GLOVE) ×4
GLOVE SURG SS PI 6.0 STRL IVOR (GLOVE) ×5 IMPLANT
GOWN STRL REUS W/TWL LRG LVL3 (GOWN DISPOSABLE) ×6 IMPLANT
NS IRRIG 1000ML POUR BTL (IV SOLUTION) ×3 IMPLANT
PACK LAPAROSCOPY BASIN (CUSTOM PROCEDURE TRAY) ×3 IMPLANT
PAD ABD 7.5X8 STRL (GAUZE/BANDAGES/DRESSINGS) ×4 IMPLANT
PENCIL BUTTON HOLSTER BLD 10FT (ELECTRODE) ×2 IMPLANT
POUCH SPECIMEN RETRIEVAL 10MM (ENDOMECHANICALS) IMPLANT
PROTECTOR NERVE ULNAR (MISCELLANEOUS) ×3 IMPLANT
SEALER TISSUE G2 CVD JAW 35 (ENDOMECHANICALS) IMPLANT
SEALER TISSUE G2 CVD JAW 45CM (ENDOMECHANICALS)
SET IRRIG TUBING LAPAROSCOPIC (IRRIGATION / IRRIGATOR) ×2 IMPLANT
SHEARS HARMONIC ACE PLUS 36CM (ENDOMECHANICALS) IMPLANT
SPONGE GAUZE 4X4 12PLY STER LF (GAUZE/BANDAGES/DRESSINGS) ×2 IMPLANT
SUT MNCRL AB 4-0 PS2 18 (SUTURE) ×3 IMPLANT
SUT VIC AB 0 CT1 18XCR BRD8 (SUTURE) IMPLANT
SUT VIC AB 0 CT1 8-18 (SUTURE) ×3
SUT VIC AB 4-0 KS 27 (SUTURE) ×2 IMPLANT
SUT VICRYL 0 UR6 27IN ABS (SUTURE) ×6 IMPLANT
TOWEL OR 17X24 6PK STRL BLUE (TOWEL DISPOSABLE) ×6 IMPLANT
TRAY FOLEY CATH 14FR (SET/KITS/TRAYS/PACK) ×3 IMPLANT
TROCAR BALLN 12MMX100 BLUNT (TROCAR) ×3 IMPLANT
TROCAR XCEL NON-BLD 5MMX100MML (ENDOMECHANICALS) IMPLANT
WATER STERILE IRR 1000ML POUR (IV SOLUTION) ×3 IMPLANT

## 2013-10-04 NOTE — ED Notes (Signed)
Report  Phoned  To  Lubertha BasqueSara   rn

## 2013-10-04 NOTE — Progress Notes (Signed)
Patient ID: Carolyn Pierce, female   DOB: [birth year], 33 y.o.   MRN: [id number] Received phone call from radiologist who confirms right ovarian torsion on MRI. Patient was consented for a right salping oophorectomy via laparoscopy. Risks, benefits and alternatives were explained including but not limited to risks of bleeding, infection and damage to adjacent organs. Patient verbalized understanding and all questions were answered.

## 2013-10-04 NOTE — ED Notes (Addendum)
Pt  Had  A  D  And  C     RECENTLY  PLACED  ON  ANTI  BIOTICS  AND  PAIN MEDS    SHE  REPORTS  HAS  BEEN  VOMITING            [facility]YA  [facility]  SYMPTOMS   -  SHE  IS  SITTING  URIGHT ON THE  EXAM TABLE  SPEAKING IN  COMPLETE  SENTANCES       ABD   IS  TENSE       MUCOUS  MEMBRANES  ARE  SLIGHTLY  DRY   Walks  Bent over

## 2013-10-04 NOTE — MAU Note (Signed)
Pt started vomiting yesterday, 5x in 24 hours.  Gets short of breath when walking. Had a D&C Thursday.  Mild cramping in abdomen.

## 2013-10-04 NOTE — Discharge Instructions (Signed)
[facility]. ° °

## 2013-10-04 NOTE — MAU Provider Note (Signed)
History     CSN: [id number]  Arrival date and time: 10/04/13 1022   None     Chief Complaint  Patient presents with  . Emesis   HPI  [name] is a.33 y.o. female [phone] who presents from urgent care with N/V times 24 hours.  She had a D/E 5 days ago for "surgical abortion." She was [redacted] weeks pregnant; she had this done here in [place].  She states she has been unable to keep anything down in the last 24 hours. She is experiencing abdominal cramping that started around the same time. Pt had her appendix removed. She was prescribed Cipro following the procedure, however she stopped taking that because she was unable to keep anything down. She has vomited approximately 5-7 times in the last 24 hours. She presented to urgent care following an episode of shortness of breath. Pt states the pain became so bad that she was feeling difficulty to catch her breath.   OB History   Grav Para Term Preterm Abortions TAB SAB Ect Mult Living   _0 Past Medical History  Diagnosis Date  . Acne   . Essential tremor     Dr. Krista Blue, neurology  . History of abnormal Pap smear   . History of UTI   . History of trichomonal vaginitis   . Menorrhagia     prior trials of Depo Provera, Ortho Tri Cyclen Lo  . Anemia     hx/o iron deficiency anemia  . Yeast vaginitis     recurrent    Past Surgical History  Procedure Laterality Date  . Appendectomy    . Wisdom tooth extraction    . Dilation and evacuation      History reviewed. No pertinent family history.  History  Substance Use Topics  . Smoking status: Never Smoker   . Smokeless tobacco: Never Used  . Alcohol Use: No     Comment: occ    Allergies: Not on File  Prescriptions prior to admission  Medication Sig Dispense Refill  . ciprofloxacin (CIPRO) 500 MG tablet Take 500 mg by mouth 2 (two) times daily. For 7 days      . propranolol (INDERAL) 20 MG tablet Take 1 tablet (20 mg total) by mouth daily.  60  tablet  12  . vitamin B-12 (CYANOCOBALAMIN) 1000 MCG tablet Take 1,000 mcg by mouth daily.       Results for orders placed during the hospital encounter of 10/04/13 (from the past 48 hour(s))  URINALYSIS, ROUTINE W REFLEX MICROSCOPIC     Status: Abnormal   Collection Time    10/04/13 10:33 AM      Result Value Ref Range   Color, Urine AMBER (*) YELLOW   Comment: BIOCHEMICALS MAY BE AFFECTED BY COLOR   APPearance HAZY (*) CLEAR   Specific Gravity, Urine >1.030 (*) 1.005 - 1.030   pH 6.0  5.0 - 8.0   Glucose, UA NEGATIVE  NEGATIVE mg/dL   Hgb urine dipstick LARGE (*) NEGATIVE   Bilirubin Urine MODERATE (*) NEGATIVE   Ketones, ur 15 (*) NEGATIVE mg/dL   Protein, ur 100 (*) NEGATIVE mg/dL   Urobilinogen, UA 1.0  0.0 - 1.0 mg/dL   Nitrite NEGATIVE  NEGATIVE   Leukocytes, UA NEGATIVE  NEGATIVE  URINE MICROSCOPIC-ADD ON     Status: Abnormal   Collection Time    10/04/13 10:33 AM  Result Value Ref Range   Squamous Epithelial / LPF FEW (*) RARE   WBC, UA 0-2  <3 WBC/hpf   RBC / HPF 21-50  <3 RBC/hpf   Casts GRANULAR CAST (*) NEGATIVE   Comment: HYALINE CASTS   Urine-Other MUCOUS PRESENT    CBC     Status: Abnormal   Collection Time    10/04/13 10:39 AM      Result Value Ref Range   WBC 30.2 (*) 4.0 - 10.5 K/uL   RBC 3.73 (*) 3.87 - 5.11 MIL/uL   Hemoglobin 10.5 (*) 12.0 - 15.0 g/dL   HCT 31.2 (*) 36.0 - 46.0 %   MCV 83.6  78.0 - 100.0 fL   MCH 28.2  26.0 - 34.0 pg   MCHC 33.7  30.0 - 36.0 g/dL   RDW 15.4  11.5 - 15.5 %   Platelets 415 (*) 150 - 400 K/uL  COMPREHENSIVE METABOLIC PANEL     Status: Abnormal   Collection Time    10/04/13 10:39 AM      Result Value Ref Range   Sodium 136 (*) 137 - 147 mEq/L   Potassium 3.6 (*) 3.7 - 5.3 mEq/L   Chloride 94 (*) 96 - 112 mEq/L   CO2 26  19 - 32 mEq/L   Glucose, Bld 130 (*) 70 - 99 mg/dL   BUN 33 (*) 6 - 23 mg/dL   Creatinine, Ser 1.26 (*) 0.50 - 1.10 mg/dL   Calcium 9.5  8.4 - 10.5 mg/dL   Total Protein 8.1  6.0 - 8.3 g/dL    Albumin 4.0  3.5 - 5.2 g/dL   AST 17  0 - 37 U/L   ALT 8  0 - 35 U/L   Alkaline Phosphatase 76  39 - 117 U/L   Total Bilirubin 1.1  0.3 - 1.2 mg/dL   GFR calc non Af Amer 56 (*) >90 mL/min   GFR calc Af Amer 65 (*) >90 mL/min   Comment: (NOTE)     The eGFR has been calculated using the CKD EPI equation.     This calculation has not been validated in all clinical situations.     eGFR's persistently <90 mL/min signify possible Chronic Kidney     Disease.   Anion gap 16 (*) 5 - 15  HCG, QUANTITATIVE, PREGNANCY     Status: Abnormal   Collection Time    10/04/13 10:39 AM      Result Value Ref Range   hCG, Beta Chain, Quant, S 6684 (*) <5 mIU/mL   Comment:              GEST. AGE      CONC.  (mIU/mL)       <=1 WEEK        5 - 50         2 WEEKS       50 - 500         3 WEEKS       100 - 10,000         4 WEEKS     1,000 - 30,000         5 WEEKS     3,500 - 115,000       6-8 WEEKS     12,000 - 270,000        12 WEEKS     15,000 - 220,000                FEMALE  AND NON-PREGNANT FEMALE:         LESS THAN 5 mIU/mL  DIFFERENTIAL     Status: Abnormal   Collection Time    10/04/13 10:39 AM      Result Value Ref Range   Neutrophils Relative % 86 (*) 43 - 77 %   Neutro Abs 25.2 (*) 1.7 - 7.7 K/uL   Lymphocytes Relative 5 (*) 12 - 46 %   Lymphs Abs 1.4  0.7 - 4.0 K/uL   Monocytes Relative 9  3 - 12 %   Monocytes Absolute 2.7 (*) 0.1 - 1.0 K/uL   Eosinophils Relative 0  0 - 5 %   Eosinophils Absolute 0.0  0.0 - 0.7 K/uL   Basophils Relative 0  0 - 1 %   Basophils Absolute 0.0  0.0 - 0.1 K/uL   US Ob Comp Less 14 Wks  10/04/2013   ADDENDUM REPORT: 10/04/2013 13:01  ADDENDUM: Correction: Please disregard the prior report. See the report below.  Intrauterine gestational sac: None  Yolk sac: None  Embryo: None  Cardiac Activity: None  Maternal uterus/adnexae: The uterus appears normal. No visible retained products of conception.  There is a complex mixed solid and cystic 9.4 x 5.4 x 8.2 cm  mass anterior and superior to the uterus. There are also dilated bowel loops of the pelvis as well as free fluid in the cul-de-sac.  The right ovary is not identified. Soft tissue density in the left adnexa most likely represents a normal left ovary.  IMPRESSION: 1. No intrauterine pregnancy or retained products of conception. 2. Complex mass anterior and superior to the uterus. This could represent an abscess, hematoma, or possibly a hemorrhagic corpus luteum cyst in a torsed right ovary. There is no perfusion to this abnormality. 3. Given this mass and the presence of dilated small bowel loops and complex fluid in the cul-de-sac and the patient's elevated Fread blood count, I recommend further evaluation by abdominal and pelvic CT scan with intravenous and oral contrast.   Electronically Signed   By: Rozetta Nunnery M.D.   On: 10/04/2013 13:01   10/04/2013   CLINICAL DATA:  Abdominal pain with nausea and vomiting and ridging abdomen since D&E on 09/30/2013.  EXAM: OBSTETRIC <14 WK [place] AND TRANSVAGINAL OB US  TECHNIQUE: Both transabdominal and transvaginal ultrasound examinations were performed for complete evaluation of the gestation as well as the maternal uterus, adnexal regions, and pelvic cul-de-sac. Transvaginal technique was performed to assess early pregnancy.  COMPARISON:  None.  FINDINGS: Intrauterine gestational sac: Visualized/normal in shape.  Yolk sac:  Embryo:  Cardiac Activity:  Heart Rate:   bpm  MSD:    mm    w     d  CRL:     mm    w  d                  [place] EDC:  Maternal uterus/adnexae:  Intrauterine gestational sac: None  Yolk sac:  None  Embryo:  None  Cardiac Activity: None  Maternal uterus/adnexae: The uterus appears normal. No visible retained products of conception.  There is a complex mixed solid and cystic 9.4 x 5.4 x 0.2 cm mass anterior and superior to the uterus. There are also dilated bowel loops of the pelvis as well as free fluid in the cul-de-sac.  The right ovary is not identified.  Soft tissue density in the left adnexa most likely represents a normal left ovary.  Electronically Signed: By:  Rozetta Nunnery M.D. On: 10/04/2013 12:44   US Ob Transvaginal  10/04/2013   ADDENDUM REPORT: 10/04/2013 13:01  ADDENDUM: Correction: Please disregard the prior report. See the report below.  Intrauterine gestational sac: None  Yolk sac: None  Embryo: None  Cardiac Activity: None  Maternal uterus/adnexae: The uterus appears normal. No visible retained products of conception.  There is a complex mixed solid and cystic 9.4 x 5.4 x 8.2 cm mass anterior and superior to the uterus. There are also dilated bowel loops of the pelvis as well as free fluid in the cul-de-sac.  The right ovary is not identified. Soft tissue density in the left adnexa most likely represents a normal left ovary.  IMPRESSION: 1. No intrauterine pregnancy or retained products of conception. 2. Complex mass anterior and superior to the uterus. This could represent an abscess, hematoma, or possibly a hemorrhagic corpus luteum cyst in a torsed right ovary. There is no perfusion to this abnormality. 3. Given this mass and the presence of dilated small bowel loops and complex fluid in the cul-de-sac and the patient's elevated Malanowski blood count, I recommend further evaluation by abdominal and pelvic CT scan with intravenous and oral contrast.   Electronically Signed   By: Rozetta Nunnery M.D.   On: 10/04/2013 13:01   10/04/2013   CLINICAL DATA:  Abdominal pain with nausea and vomiting and ridging abdomen since D&E on 09/30/2013.  EXAM: OBSTETRIC <14 WK [place] AND TRANSVAGINAL OB US  TECHNIQUE: Both transabdominal and transvaginal ultrasound examinations were performed for complete evaluation of the gestation as well as the maternal uterus, adnexal regions, and pelvic cul-de-sac. Transvaginal technique was performed to assess early pregnancy.  COMPARISON:  None.  FINDINGS: Intrauterine gestational sac: Visualized/normal in shape.  Yolk sac:  Embryo:   Cardiac Activity:  Heart Rate:   bpm  MSD:    mm    w     d  CRL:     mm    w  d                  [place] EDC:  Maternal uterus/adnexae:  Intrauterine gestational sac: None  Yolk sac:  None  Embryo:  None  Cardiac Activity: None  Maternal uterus/adnexae: The uterus appears normal. No visible retained products of conception.  There is a complex mixed solid and cystic 9.4 x 5.4 x 0.2 cm mass anterior and superior to the uterus. There are also dilated bowel loops of the pelvis as well as free fluid in the cul-de-sac.  The right ovary is not identified. Soft tissue density in the left adnexa most likely represents a normal left ovary.  Electronically Signed: By: Rozetta Nunnery M.D. On: 10/04/2013 12:44    Review of Systems  Constitutional: Negative for fever (Highest temp was 99.0) and chills.  Gastrointestinal: Positive for nausea, vomiting and abdominal pain (All over abdomen ). Negative for diarrhea and constipation.  Genitourinary: Positive for frequency. Negative for dysuria, urgency and hematuria.       No vaginal discharge. + vaginal bleeding; light spotting  No dysuria.   Physical Exam   Blood pressure 114/70, pulse 119, temperature 98.5 F (36.9 C), temperature source Oral, resp. rate 16, height _0  (1.575 m), weight 54.885 kg (121 lb).  Physical Exam  Constitutional: She is oriented to person, place, and time. She appears well-developed and well-nourished.  Non-toxic appearance. She has a sickly appearance. She appears ill. No distress.  HENT:  Head: Normocephalic.  Eyes:  Pupils are equal, round, and reactive to light.  Neck: Neck supple.  Cardiovascular: Tachycardia present.   Respiratory: Effort normal.  GI: She exhibits distension. Bowel sounds are absent. There is tenderness in the right upper quadrant, right lower quadrant, periumbilical area, suprapubic area and left lower quadrant. There is rigidity and guarding.  Genitourinary:     Musculoskeletal: Normal range of motion.   Neurological: She is alert and oriented to person, place, and time.  Skin: Skin is warm.  Psychiatric: Her behavior is normal.    MAU Course  Procedures none  MDM Upon arrival to MAU; I was called to the room LR bolus CBC CMP UA Beta hcg level US OB Dr. Hassell Done with general surgery here to evaluate  Patient  CT of abdomen and pelvis  CBC with diff added Pt received 25 mg IV of phenergan while in MAU Dr. Elly Modena to MAU to see the patient; pt to be admitted to the 3rd floor.   Assessment and Plan   A:  Uterine Mass; possible abscess. CT results pending  Acute renal failure Acute abdominal pain  Leukocytosis   P: CT scan of abdomen/pelvis Admission to 3rd floor for antibiotics and further observation/ assessment   Darrelyn Hillock Rochell Mabie, NP 10/04/2013, 3:17 PM

## 2013-10-04 NOTE — Progress Notes (Signed)
Patient ID: Carolyn Pierce, female   DOB: 03/22/1980, 33 y.o.   MRN: [id number] Patient reports doing well without persistent generalized abdominal cramping pain. Pain is worst with movement. Pain is well controlled with pain medication  Filed Vitals:   10/04/13 1401  BP:   Pulse:   Temp: 98 F (36.7 C)  Resp:    Results of updated ultrasound report and CT scan reviewed with patient which now shows an 9.8 cm anterior uterine mass of uncertain etiology (right ovarian torsion vs hematoma vs abscess)  Offered patient further imaging with MRI to better identify right ovary and etiology of mass or diagnostic laparoscopy with possible oophorectomy if torsion. Patient opted for MRI  Case was also discussed with general surgery who agrees with current management Interventional radiology was also consulted who does not think that this mass is amenable for drainage. He also recommended MRI for further identification of this mass  Patient is aware that she may still need surgical intervention

## 2013-10-04 NOTE — ED Provider Notes (Addendum)
CSN: [id number]     Arrival date & time 10/04/13  0911 History   First MD Initiated Contact with Patient 10/04/13 [phone]     Chief Complaint  Patient presents with  . Emesis   (Consider location/radiation/quality/duration/timing/severity/associated sxs/prior Treatment) Patient is a [age] y.o. female presenting with vomiting. The history is provided by the patient.  Emesis Severity:  Moderate Duration:  2 days Quality:  Stomach contents Progression:  Unchanged Chronicity:  New Context comment:  S/p D+C on tues, vomiting nonstop since fri, pain on thurs, no fever. Associated symptoms: abdominal pain   Associated symptoms: no chills and no fever   Risk factors: prior abdominal surgery     Past Medical History  Diagnosis Date  . Acne   . Essential tremor     Dr. Terrace ArabiaYan, neurology  . History of abnormal Pap smear   . History of UTI   . History of trichomonal vaginitis   . Menorrhagia     prior trials of Depo Provera, Ortho Tri Cyclen Lo  . Anemia     hx/o iron deficiency anemia  . Yeast vaginitis     recurrent   Past Surgical History  Procedure Laterality Date  . Appendectomy    . Wisdom tooth extraction    . Dilation and evacuation     History reviewed. No pertinent family history. History  Substance Use Topics  . Smoking status: Never Smoker   . Smokeless tobacco: Never Used  . Alcohol Use: No     Comment: occ   OB History   Grav Para Term Preterm Abortions TAB SAB Ect Mult Living                 Review of Systems  Constitutional: Negative.  Negative for chills.  Gastrointestinal: Positive for nausea, vomiting and abdominal pain.  Genitourinary: Negative.   Skin: Negative.     Allergies  Review of patient's allergies indicates no known allergies.  Home Medications   Prior to Admission medications   Medication Sig Start Date End Date Taking? Authorizing Provider  ciprofloxacin (CIPRO) 500 MG tablet Take 500 mg by mouth 2 (two) times daily. For 7 days     Historical Provider, MD  ibuprofen (ADVIL,MOTRIN) 200 MG tablet Take 800 mg by mouth every 8 (eight) hours as needed for moderate pain.    Historical Provider, MD  propranolol (INDERAL) 20 MG tablet Take 1 tablet (20 mg total) by mouth daily. 12/26/12   Levert FeinsteinYijun Yan, MD  vitamin B-12 (CYANOCOBALAMIN) 1000 MCG tablet Take 1,000 mcg by mouth daily.    Historical Provider, MD   BP 118/83  Pulse 133  Temp(Src) 98.8 F (37.1 C) (Oral)  Resp 16  SpO2 97% Physical Exam  Nursing note and vitals reviewed. Constitutional: She is oriented to person, place, and time. She appears well-developed and well-nourished. She appears distressed.  Walking bent over  Neck: Normal range of motion. Neck supple.  Abdominal: She exhibits distension. She exhibits no mass. Bowel sounds are decreased. There is generalized tenderness. There is rigidity, rebound and guarding. There is no CVA tenderness.  Neurological: She is alert and oriented to person, place, and time.  Skin: Skin is warm and dry.    ED Course  Procedures (including critical care time) Labs Review Labs Reviewed - No data to display  Imaging Review No results found.   MDM   1. Abdominal pain in female patient        Linna HoffJames D Kindl, MD 10/04/13 [id number]  Fayrene FearingJames  Sallyanne Kuster, MD 10/04/13 1009

## 2013-10-04 NOTE — Progress Notes (Signed)
Patient transported to [facility] via [facility].

## 2013-10-04 NOTE — Anesthesia Preprocedure Evaluation (Signed)
Anesthesia Evaluation  Patient identified by MRN, date of birth, ID band Patient awake    Reviewed: Allergy & Precautions, H&P , Patient's Chart, lab work & pertinent test results, reviewed documented beta blocker date and time   History of Anesthesia Complications Negative for: history of anesthetic complications  Airway Mallampati: II TM Distance: >3 FB Neck ROM: full    Dental   Pulmonary  breath sounds clear to auscultation        Cardiovascular Exercise Tolerance: Good Rhythm:regular Rate:Normal     Neuro/Psych    GI/Hepatic   Endo/Other    Renal/GU      Musculoskeletal   Abdominal   Peds  Hematology  (+) anemia ,   Anesthesia Other Findings   Reproductive/Obstetrics                           Anesthesia Physical Anesthesia Plan  ASA: II and emergent  Anesthesia Plan: General ETT   Post-op Pain Management:    Induction: Rapid sequence and Cricoid pressure planned  Airway Management Planned: Oral ETT  Additional Equipment:   Intra-op Plan:   Post-operative Plan:   Informed Consent: I have reviewed the patients History and Physical, chart, labs and discussed the procedure including the risks, benefits and alternatives for the proposed anesthesia with the patient or authorized representative who has indicated his/her understanding and acceptance.   Dental Advisory Given  Plan Discussed with: CRNA and Surgeon  Anesthesia Plan Comments:         Anesthesia Quick Evaluation

## 2013-10-04 NOTE — H&P (Signed)
Carolyn Pierce is an 33 y.o. female [phone] s/p surgical termination of a 7 week pregnancy on 8/11 presenting to ED for evaluation of worsening abdominal pain, nausea and emesis over the past 48 hours. Patient reports severe abdominal pain following the procedure. She reports taking her father's vicodin all day on 8/12 to help with the pain. Patient experience abdominal pain and emesis on 8/13 which worsen on 8/14 to the point where she could not keep fluids down. Patient denies fevers or chills. She reports passing flatus. She has had some shortness of breath  Pertinent Gynecological History: Menses: flow is moderate Bleeding: monthly Contraception: none DES exposure: denies Blood transfusions: none Sexually transmitted diseases: no past history Previous GYN Procedures: DNC  Last mammogram: n/a  Last pap: normal Date: a few years ago OB History: G5, P1131   Menstrual History: No LMP recorded. Patient is not currently having periods (Reason: Other).    Past Medical History  Diagnosis Date  . Acne   . Essential tremor     Dr. Terrace Arabia, neurology  . History of abnormal Pap smear   . History of UTI   . History of trichomonal vaginitis   . Menorrhagia     prior trials of Depo Provera, Ortho Tri Cyclen Lo  . Anemia     hx/o iron deficiency anemia  . Yeast vaginitis     recurrent    Past Surgical History  Procedure Laterality Date  . Appendectomy    . Wisdom tooth extraction    . Dilation and evacuation      History reviewed. No pertinent family history.  Social History:  reports that she has never smoked. She has never used smokeless tobacco. She reports that she does not drink alcohol or use illicit drugs.  Allergies: Not on File  Prescriptions prior to admission  Medication Sig Dispense Refill  . ciprofloxacin (CIPRO) 500 MG tablet Take 500 mg by mouth 2 (two) times daily. For 7 days      . propranolol (INDERAL) 20 MG tablet Take 1 tablet (20 mg total) by mouth daily.  60  tablet  12  . vitamin B-12 (CYANOCOBALAMIN) 1000 MCG tablet Take 1,000 mcg by mouth daily.        Review of Systems  All other systems reviewed and are negative.   Blood pressure 118/69, pulse 96, temperature 98 F (36.7 C), temperature source Oral, resp. rate 18, height 5\' 2"  (1.575 m), weight 121 lb (54.885 kg). Physical Exam GENERAL: Well-developed, well-nourished female in no acute distress.  HEENT: Normocephalic, atraumatic. Sclerae anicteric.  NECK: Supple. Normal thyroid.  LUNGS: Clear to auscultation bilaterally.  HEART: Regular rate and rhythm. ABDOMEN: Soft, diffusely tender, mildly distended. Absent bowel sounds.  PELVIC: Normal external female genitalia. Vagina is pink and rugated.  Normal discharge. Normal appearing cervix. Uterus is normal in size. Tender to palpation.  EXTREMITIES: No cyanosis, clubbing, or edema, 2+ distal pulses.   Results for orders placed during the hospital encounter of 10/04/13 (from the past 24 hour(s))  URINALYSIS, ROUTINE W REFLEX MICROSCOPIC     Status: Abnormal   Collection Time    10/04/13 10:33 AM      Result Value Ref Range   Color, Urine AMBER (*) YELLOW   APPearance HAZY (*) CLEAR   Specific Gravity, Urine >1.030 (*) 1.005 - 1.030   pH 6.0  5.0 - 8.0   Glucose, UA NEGATIVE  NEGATIVE mg/dL   Hgb urine dipstick LARGE (*) NEGATIVE   Bilirubin Urine  MODERATE (*) NEGATIVE   Ketones, ur 15 (*) NEGATIVE mg/dL   Protein, ur 161 (*) NEGATIVE mg/dL   Urobilinogen, UA 1.0  0.0 - 1.0 mg/dL   Nitrite NEGATIVE  NEGATIVE   Leukocytes, UA NEGATIVE  NEGATIVE  URINE MICROSCOPIC-ADD ON     Status: Abnormal   Collection Time    10/04/13 10:33 AM      Result Value Ref Range   Squamous Epithelial / LPF FEW (*) RARE   WBC, UA 0-2  <3 WBC/hpf   RBC / HPF 21-50  <3 RBC/hpf   Casts GRANULAR CAST (*) NEGATIVE   Urine-Other MUCOUS PRESENT    CBC     Status: Abnormal   Collection Time    10/04/13 10:39 AM      Result Value Ref Range   WBC 30.2 (*)  4.0 - 10.5 K/uL   RBC 3.73 (*) 3.87 - 5.11 MIL/uL   Hemoglobin 10.5 (*) 12.0 - 15.0 g/dL   HCT 09.6 (*) 04.5 - 40.9 %   MCV 83.6  78.0 - 100.0 fL   MCH 28.2  26.0 - 34.0 pg   MCHC 33.7  30.0 - 36.0 g/dL   RDW 81.1  91.4 - 78.2 %   Platelets 415 (*) 150 - 400 K/uL  COMPREHENSIVE METABOLIC PANEL     Status: Abnormal   Collection Time    10/04/13 10:39 AM      Result Value Ref Range   Sodium 136 (*) 137 - 147 mEq/L   Potassium 3.6 (*) 3.7 - 5.3 mEq/L   Chloride 94 (*) 96 - 112 mEq/L   CO2 26  19 - 32 mEq/L   Glucose, Bld 130 (*) 70 - 99 mg/dL   BUN 33 (*) 6 - 23 mg/dL   Creatinine, Ser 9.56 (*) 0.50 - 1.10 mg/dL   Calcium 9.5  8.4 - 21.3 mg/dL   Total Protein 8.1  6.0 - 8.3 g/dL   Albumin 4.0  3.5 - 5.2 g/dL   AST 17  0 - 37 U/L   ALT 8  0 - 35 U/L   Alkaline Phosphatase 76  39 - 117 U/L   Total Bilirubin 1.1  0.3 - 1.2 mg/dL   GFR calc non Af Amer 56 (*) >90 mL/min   GFR calc Af Amer 65 (*) >90 mL/min   Anion gap 16 (*) 5 - 15  HCG, QUANTITATIVE, PREGNANCY     Status: Abnormal   Collection Time    10/04/13 10:39 AM      Result Value Ref Range   hCG, Beta Chain, Quant, S 6684 (*) <5 mIU/mL  DIFFERENTIAL     Status: Abnormal   Collection Time    10/04/13 10:39 AM      Result Value Ref Range   Neutrophils Relative % 86 (*) 43 - 77 %   Neutro Abs 25.2 (*) 1.7 - 7.7 K/uL   Lymphocytes Relative 5 (*) 12 - 46 %   Lymphs Abs 1.4  0.7 - 4.0 K/uL   Monocytes Relative 9  3 - 12 %   Monocytes Absolute 2.7 (*) 0.1 - 1.0 K/uL   Eosinophils Relative 0  0 - 5 %   Eosinophils Absolute 0.0  0.0 - 0.7 K/uL   Basophils Relative 0  0 - 1 %   Basophils Absolute 0.0  0.0 - 0.1 K/uL    US Ob Comp Less 14 Wks  10/04/2013   ADDENDUM REPORT: 10/04/2013 13:01  ADDENDUM: Correction: Please disregard  the prior report. See the report below.  Intrauterine gestational sac: None  Yolk sac: None  Embryo: None  Cardiac Activity: None  Maternal uterus/adnexae: The uterus appears normal. No visible  retained products of conception.  There is a complex mixed solid and cystic 9.4 x 5.4 x 8.2 cm mass anterior and superior to the uterus. There are also dilated bowel loops of the pelvis as well as free fluid in the cul-de-sac.  The right ovary is not identified. Soft tissue density in the left adnexa most likely represents a normal left ovary.  IMPRESSION: 1. No intrauterine pregnancy or retained products of conception. 2. Complex mass anterior and superior to the uterus. This could represent an abscess, hematoma, or possibly a hemorrhagic corpus luteum cyst in a torsed right ovary. There is no perfusion to this abnormality. 3. Given this mass and the presence of dilated small bowel loops and complex fluid in the cul-de-sac and the patient's elevated Berkery blood count, I recommend further evaluation by abdominal and pelvic CT scan with intravenous and oral contrast.   Electronically Signed   By: Geanie Cooley M.D.   On: 10/04/2013 13:01   10/04/2013   CLINICAL DATA:  Abdominal pain with nausea and vomiting and ridging abdomen since D&E on 09/30/2013.  EXAM: OBSTETRIC <14 WK [place] AND TRANSVAGINAL OB US  TECHNIQUE: Both transabdominal and transvaginal ultrasound examinations were performed for complete evaluation of the gestation as well as the maternal uterus, adnexal regions, and pelvic cul-de-sac. Transvaginal technique was performed to assess early pregnancy.  COMPARISON:  None.  FINDINGS: Intrauterine gestational sac: Visualized/normal in shape.  Yolk sac:  Embryo:  Cardiac Activity:  Heart Rate:   bpm  MSD:    mm    w     d  CRL:     mm    w  d                  [place] EDC:  Maternal uterus/adnexae:  Intrauterine gestational sac: None  Yolk sac:  None  Embryo:  None  Cardiac Activity: None  Maternal uterus/adnexae: The uterus appears normal. No visible retained products of conception.  There is a complex mixed solid and cystic 9.4 x 5.4 x 0.2 cm mass anterior and superior to the uterus. There are also dilated bowel  loops of the pelvis as well as free fluid in the cul-de-sac.  The right ovary is not identified. Soft tissue density in the left adnexa most likely represents a normal left ovary.  Electronically Signed: By: Geanie Cooley M.D. On: 10/04/2013 12:44   US Ob Transvaginal  10/04/2013   ADDENDUM REPORT: 10/04/2013 13:01  ADDENDUM: Correction: Please disregard the prior report. See the report below.  Intrauterine gestational sac: None  Yolk sac: None  Embryo: None  Cardiac Activity: None  Maternal uterus/adnexae: The uterus appears normal. No visible retained products of conception.  There is a complex mixed solid and cystic 9.4 x 5.4 x 8.2 cm mass anterior and superior to the uterus. There are also dilated bowel loops of the pelvis as well as free fluid in the cul-de-sac.  The right ovary is not identified. Soft tissue density in the left adnexa most likely represents a normal left ovary.  IMPRESSION: 1. No intrauterine pregnancy or retained products of conception. 2. Complex mass anterior and superior to the uterus. This could represent an abscess, hematoma, or possibly a hemorrhagic corpus luteum cyst in a torsed right ovary. There is no perfusion to  this abnormality. 3. Given this mass and the presence of dilated small bowel loops and complex fluid in the cul-de-sac and the patient's elevated Nicholls blood count, I recommend further evaluation by abdominal and pelvic CT scan with intravenous and oral contrast.   Electronically Signed   By: Geanie CooleyJim  Maxwell M.D.   On: 10/04/2013 13:01   10/04/2013   CLINICAL DATA:  Abdominal pain with nausea and vomiting and ridging abdomen since D&E on 09/30/2013.  EXAM: OBSTETRIC <14 WK US AND TRANSVAGINAL OB US  TECHNIQUE: Both transabdominal and transvaginal ultrasound examinations were performed for complete evaluation of the gestation as well as the maternal uterus, adnexal regions, and pelvic cul-de-sac. Transvaginal technique was performed to assess early pregnancy.  COMPARISON:   None.  FINDINGS: Intrauterine gestational sac: Visualized/normal in shape.  Yolk sac:  Embryo:  Cardiac Activity:  Heart Rate:   bpm  MSD:    mm    w     d  CRL:     mm    w  d                  US EDC:  Maternal uterus/adnexae:  Intrauterine gestational sac: None  Yolk sac:  None  Embryo:  None  Cardiac Activity: None  Maternal uterus/adnexae: The uterus appears normal. No visible retained products of conception.  There is a complex mixed solid and cystic 9.4 x 5.4 x 0.2 cm mass anterior and superior to the uterus. There are also dilated bowel loops of the pelvis as well as free fluid in the cul-de-sac.  The right ovary is not identified. Soft tissue density in the left adnexa most likely represents a normal left ovary.  Electronically Signed: By: Geanie CooleyJim  Maxwell M.D. On: 10/04/2013 12:44    Assessment/Plan: 33 yo s/p D&E on 8/11 with presumed pelvic abscess - Will admit for IV antibiotics - Will obtain CT abd pelvis for further evaluation - Dr. Daphine DeutscherMartin from general surgery consulted and came to see patient in MAU. Also recommend treating as a pelvic abscess pending CT report - pain management prn  Carolyn Pierce 10/04/2013, 2:08 PM

## 2013-10-05 DIAGNOSIS — N83519 Torsion of ovary and ovarian pedicle, unspecified side: Secondary | ICD-10-CM | POA: Diagnosis present

## 2013-10-05 DIAGNOSIS — N8353 Torsion of ovary, ovarian pedicle and fallopian tube: Principal | ICD-10-CM

## 2013-10-05 LAB — BASIC METABOLIC PANEL
ANION GAP: 8 (ref 5–15)
BUN: 17 mg/dL (ref 6–23)
CO2: 28 mEq/L (ref 19–32)
Calcium: 8.3 mg/dL — ABNORMAL LOW (ref 8.4–10.5)
Chloride: 99 mEq/L (ref 96–112)
Creatinine, Ser: 0.83 mg/dL (ref 0.50–1.10)
GFR calc Af Amer: >90 mL/min (ref >90)
GLUCOSE: 131 mg/dL — AB (ref 70–99)
POTASSIUM: 4.7 meq/L (ref 3.7–5.3)
SODIUM: 135 meq/L — AB (ref 137–147)

## 2013-10-05 LAB — CBC
HCT: 23.8 % — ABNORMAL LOW (ref 36.0–46.0)
Hemoglobin: 7.8 g/dL — ABNORMAL LOW (ref 12.0–15.0)
MCH: 27.7 pg (ref 26.0–34.0)
MCHC: 32.8 g/dL (ref 30.0–36.0)
MCV: 84.4 fL (ref 78.0–100.0)
PLATELETS: 351 10*3/uL (ref 150–400)
RBC: 2.82 MIL/uL — ABNORMAL LOW (ref 3.87–5.11)
RDW: 15.3 % (ref 11.5–15.5)
WBC: 17.9 10*3/uL — AB (ref 4.0–10.5)

## 2013-10-05 MED ORDER — HYDROMORPHONE 0.3 MG/ML IV SOLN
INTRAVENOUS | Status: DC
  Administered 2013-10-05: 0.4 mg via INTRAVENOUS
  Administered 2013-10-05: 03:00:00 via INTRAVENOUS
  Filled 2013-10-05: qty 25

## 2013-10-05 MED ORDER — LACTATED RINGERS IV SOLN
INTRAVENOUS | Status: DC
  Administered 2013-10-05: 05:00:00 via INTRAVENOUS

## 2013-10-05 MED ORDER — FENTANYL CITRATE 0.05 MG/ML IJ SOLN
INTRAMUSCULAR | Status: AC
  Administered 2013-10-05: 50 ug via INTRAVENOUS
  Filled 2013-10-05: qty 2

## 2013-10-05 MED ORDER — GLYCOPYRROLATE 0.2 MG/ML IJ SOLN
INTRAMUSCULAR | Status: DC | PRN
  Administered 2013-10-05: 0.6 mg via INTRAVENOUS

## 2013-10-05 MED ORDER — MEPERIDINE HCL 25 MG/ML IJ SOLN: 6.2500 mg | INTRAMUSCULAR | Status: DC | PRN

## 2013-10-05 MED ORDER — ONDANSETRON HCL 4 MG/2ML IJ SOLN: 4.0000 mg | Freq: Four times a day (QID) | INTRAMUSCULAR | Status: DC | PRN

## 2013-10-05 MED ORDER — DIPHENHYDRAMINE HCL 50 MG/ML IJ SOLN: 12.5000 mg | Freq: Four times a day (QID) | INTRAMUSCULAR | Status: DC | PRN

## 2013-10-05 MED ORDER — SODIUM CHLORIDE 0.9 % IJ SOLN: 9.0000 mL | INTRAMUSCULAR | Status: DC | PRN

## 2013-10-05 MED ORDER — NEOSTIGMINE METHYLSULFATE 10 MG/10ML IV SOLN
INTRAVENOUS | Status: DC | PRN
  Administered 2013-10-05: 4 mg via INTRAVENOUS

## 2013-10-05 MED ORDER — FENTANYL CITRATE 0.05 MG/ML IJ SOLN
25.0000 ug | INTRAMUSCULAR | Status: DC | PRN
  Administered 2013-10-05 (×2): 50 ug via INTRAVENOUS

## 2013-10-05 MED ORDER — DIPHENHYDRAMINE HCL 12.5 MG/5ML PO ELIX
12.5000 mg | ORAL_SOLUTION | Freq: Four times a day (QID) | ORAL | Status: DC | PRN
  Filled 2013-10-05: qty 5

## 2013-10-05 MED ORDER — NALOXONE HCL 0.4 MG/ML IJ SOLN: 0.4000 mg | INTRAMUSCULAR | Status: DC | PRN

## 2013-10-05 MED ORDER — PROMETHAZINE HCL 25 MG/ML IJ SOLN: 6.2500 mg | INTRAMUSCULAR | Status: DC | PRN

## 2013-10-05 MED ORDER — KETOROLAC TROMETHAMINE 30 MG/ML IJ SOLN: 15.0000 mg | Freq: Once | INTRAMUSCULAR | Status: DC | PRN

## 2013-10-05 MED ORDER — MIDAZOLAM HCL 2 MG/2ML IJ SOLN: 0.5000 mg | Freq: Once | INTRAMUSCULAR | Status: DC | PRN

## 2013-10-05 MED ORDER — OXYCODONE-ACETAMINOPHEN 5-325 MG PO TABS
1.0000 | ORAL_TABLET | ORAL | Status: DC | PRN
  Administered 2013-10-05: 2 via ORAL
  Administered 2013-10-05: 1 via ORAL
  Filled 2013-10-05: qty 2
  Filled 2013-10-05: qty 1

## 2013-10-05 MED ORDER — OXYCODONE-ACETAMINOPHEN 5-325 MG PO TABS
1.0000 | ORAL_TABLET | ORAL | Status: DC | PRN
Start: 2013-10-05 — End: 2013-10-09

## 2013-10-05 NOTE — Progress Notes (Signed)
Discharge instructions reviewed with patient.  Patient states understanding of home care, activity, medications, signs/symptoms to report to MD and return MD office visit.  Patients significant other will assist with her care @ home and no home equipment needed.  Patient discharged  Per wheelchair in stable condition with staff without incident.

## 2013-10-05 NOTE — Transfer of Care (Signed)
Immediate Anesthesia Transfer of Care Note  Patient: Carolyn BarterPatrice L Pierce  Procedure(s) Performed: Procedure(s): LAPAROSCOPY OPERATIVE (N/A)  Patient Location: PACU  Anesthesia Type:General  Level of Consciousness: awake, alert  and oriented  Airway & Oxygen Therapy: Patient Spontanous Breathing and Patient connected to nasal cannula oxygen  Post-op Assessment: Report given to PACU RN and Post -op Vital signs reviewed and stable  Post vital signs: Reviewed and stable  Complications: No apparent anesthesia complications

## 2013-10-05 NOTE — Discharge Summary (Signed)
Physician Discharge Summary  Patient ID: Carolyn Pierce MRN: [id number] DOB/AGE: 29-Nov-1980 33 y.o.  Admit date: 10/04/2013 Discharge date: 10/05/2013  Admission Diagnoses:pelvic pain and possible abscess vs torsion  Discharge Diagnoses: right ovarian torsion Active Problems:   Torsion of ovarian pedicle   Discharged Condition: good  Hospital Course: [id number] No LMP recorded. Patient is not currently having periods (Reason: Other). Presented with pelvic pain and mass, suspected abscess which was further evaluated by CT and MRI, diagnosing right ovarian torsion. She had a laparoscopy and laparotomy with right oophorectomy.   Consults: None  Significant Diagnostic Studies: radiology: MRI:  , CT scan:   and Ultrasound: pelvic  Treatments: IV hydration, antibiotics: Zosyn and surgery: laparoscopy, laparotomy, right salpingo-oophorectomy  Discharge Exam: Blood pressure 120/64, pulse 103, temperature 99.1 F (37.3 C), temperature source Oral, resp. rate 18, height 5\' 2"  (1.575 m), weight 54.885 kg (121 lb), SpO2 97.00%. General appearance: alert, cooperative and no distress GI: soft, non-tender; bowel sounds normal; no masses,  no organomegaly and dressing dry and intact  Disposition: home    Medication List         ciprofloxacin 500 MG tablet  Commonly known as:  CIPRO  Take 500 mg by mouth 2 (two) times daily. For 7 days     oxyCODONE-acetaminophen 5-325 MG per tablet  Commonly known as:  PERCOCET/ROXICET  Take 1-2 tablets by mouth every 4 (four) hours as needed for severe pain.     propranolol 20 MG tablet  Commonly known as:  INDERAL  Take 1 tablet (20 mg total) by mouth daily.     vitamin B-12 1000 MCG tablet  Commonly known as:  CYANOCOBALAMIN  Take 1,000 mcg by mouth daily.         Signed: ARNOLD,JAMES 10/05/2013, 4:59 PM

## 2013-10-05 NOTE — Op Note (Signed)
PROCEDURE DATE:10/05/2013  PREOPERATIVE DIAGNOSIS:  Right ovarian torsion POSTOPERATIVE DIAGNOSIS:  The same SURGEON:   Catalina AntiguaPeggy Moranda Billiot, M.D. ASSISTANT: none OPERATION:  Diagnostic laparoscopy, Exploratory laparotomy,  Right Salpingoohorectomy ANESTHESIA:  General endotracheal.  INDICATIONS: The patient is a 33 y.o. [id number] with right ovarian torsion. The risks of surgery were again discussed with the patient including but not limited to: bleeding which may require transfusion or reoperation; infection which may require antibiotics; injury to bowel, bladder, ureters or other surrounding organs; need for additional procedures; thromboembolic phenomenon, incisional problems and other postoperative/anesthesia complications. Written informed consent was obtained.    OPERATIVE FINDINGS: A 6 week size uterus with normal left fallopian tube and ovary.   Necrotic appearing right fallopian tube and ovary measuring 10 cm. No other anomalies noted in pelvis, no lesions of endometriosis.    ESTIMATED BLOOD LOSS: 200 ml FLUIDS:  2800 ml of Lactated Ringers URINE OUTPUT:  25 ml of concentrated urine. SPECIMENS:  Right fallopian tube and ovary sent to pathology COMPLICATIONS:  None immediate.  DESCRIPTION OF PROCEDURE:  The patient was taken to the operating room where general anesthesia was obtained without difficulty.  She was then placed in the dorsal lithotomy position and prepared and draped in sterile fashion.  After an adequate timeout was performed, a bivalved speculum was then placed in the patient's vagina, and the anterior lip of cervix grasped with the single-tooth tenaculum.  The uterine manipulator was then advanced into the uterus.  The speculum was removed from the vagina. A Foley catheter was inserted into the bladder and attached to Thierry Dobosz drainage.Attention was then turned to the patient's abdomen where a 10-mm skin incision was made on the umbilical fold.  The underlying fascia was  identified, grasped with Kocher clamps, tented up and entered sharply with mayo scissors. The fascia was tagged with 0-Vicryl. The peritoneum was entered bluntly.The 11 mm trocar and sleeve were introduced into the abdominal cavityl.  Intraperitoneal placement was confirmed with the use of the laparoscope. Pneumoperitoneum was achieved by the insufflation of CO2 gas. A survey of the patient's pelvis and abdomen revealed entirely normal anatomy with the exception of an enlarged right adnexal structure. It was difficult to mobile the adnexa and clearly identify the infundibular pelvic ligament. The decision was made to convert to a laparotomy.  The patient was placed in supine position.   A Pfannensteil skin incision was made. This incision was taken down to the fascia using electrocautery with care given to maintain good hemostasis. The fascia was incised in the midline and the fascial incision was then extended bilaterally using electrocautery without difficulty. The fascia was then dissected off the underlying rectus muscles using blunt and sharp dissection. The rectus muscles were split bluntly in the midline and the peritoneum entered sharply without complication. This peritoneal incision was then extended superiorly and inferiorly with care given to prevent bowel or bladder injury.  Attention was then turned to the pelvis. The bowel was packed away with moist laparotomy sponges. The right ovary at this point was noted to be mobilized and was delivered up out of the abdomen.  The right  infundibulopelvic ligament clamped on the patient's right side. This pedicle was then clamped, cut, and doubly suture ligated with 0 Vicryl.  The pelvis was irrigated and hemostasis was reconfirmed at all pedicles.  All laparotomy sponges and instruments were removed from the abdomen. The fascia was also closed in a running fashion with 0 Vicryl suture. The skin was closed with a  4-0 Vicryl subcuticular stitch. Sponge, lap,  needle, and instrument counts were correct times two. The patient was taken to the recovery area awake, extubated and in stable condition.

## 2013-10-05 NOTE — Addendum Note (Signed)
Addendum created 10/05/13 0912 by Turner DanielsJennifer L Tavarion Babington, CRNA   Modules edited: Notes Section   Notes Section:  File: [id number]

## 2013-10-05 NOTE — Discharge Instructions (Signed)
Laparoscopic Ovarian Torsion Surgery °Care After °Refer to this sheet in the next few weeks. These instructions provide you with information on caring for yourself after your procedure. Your caregiver may also give you more specific instructions. Your treatment has been planned according to current medical practices, but problems sometimes occur. Call your caregiver if you have any problems or questions after your procedure. °HOME CARE INSTRUCTIONS °· Take any medicine as directed by your caregiver. Follow the directions carefully. °· Check your incisions every day. °· Keep the incision area(s) dry. Ask your caregiver when it is safe to shower or bathe again. °· Rest as much as possible for the next 3 days. Ask your caregiver when it is safe to go back to your normal activities. °· Drink enough fluids to keep your urine clear or pale yellow. °· Keep all follow-up appointments. Your caregiver will make sure you are healing the way you should be. °SEEK MEDICAL CARE IF:  °· You have bleeding or discharge from your vagina. °· You have pain in your abdomen. °· You feel nauseous. °SEEK IMMEDIATE MEDICAL CARE IF:  °· Your incision(s) becomes red, swollen, or tender. °· Your incision(s) start(s) bleeding. °· You have pus coming from any incision. °· You have heavy or persistent vaginal bleeding or discharge. °· You have severe or increased abdominal pain. °· You cannot stop vomiting. °· Your nausea will not go away. °· You have a fever. °MAKE SURE YOU: °· Understand these instructions. °· Watch your condition. °· Get help right away if you are not doing well or get worse. °Document Released: 01/26/2011 Document Revised: 05/01/2011 Document Reviewed: 01/26/2011 °ExitCare® Patient Information ©2015 [employer]. This information is not intended to replace advice given to you by your health care provider. Make sure you discuss any questions you have with your health care provider. ° °

## 2013-10-05 NOTE — MAU Provider Note (Signed)
Attestation of Attending Supervision of Advanced Practitioner (CNM/NP): Evaluation and management procedures were performed by the Advanced Practitioner under my supervision and collaboration.  I have reviewed the Advanced Practitioner's note and chart, and I agree with the management and plan.  Blessings Inglett 10/05/2013 7:50 AM

## 2013-10-05 NOTE — Anesthesia Postprocedure Evaluation (Signed)
  Anesthesia Post-op Note  Anesthesia Post Note  Patient: Carolyn Pierce  Procedure(s) Performed: Procedure(s) (LRB): LAPAROSCOPY OPERATIVE, Removal of Right Fallopian Tube and Ovary (N/A)  Anesthesia type: General  Patient location: [facility]  Post pain: Pain level controlled  Post assessment: Post-op Vital signs reviewed  Last Vitals:  Filed Vitals:   10/05/13 0652  BP: 114/63  Pulse: 98  Temp: 37.1 C  Resp: 18    Post vital signs: Reviewed  Level of consciousness: sedated  Complications: No apparent anesthesia complications

## 2013-10-05 NOTE — Anesthesia Postprocedure Evaluation (Signed)
Anesthesia Post Note  Patient: Carolyn Pierce  Procedure(s) Performed: Procedure(s) (LRB): LAPAROSCOPY OPERATIVE (N/A)  Anesthesia type: GA  Patient location: PACU  Post pain: Pain level controlled  Post assessment: Post-op Vital signs reviewed  Last Vitals:  Filed Vitals:   10/04/13 2204  BP: 126/78  Pulse: 100  Temp: 37.2 C  Resp: 18    Post vital signs: Reviewed  Level of consciousness: sedated  Complications: No apparent anesthesia complications

## 2013-10-06 ENCOUNTER — Encounter (HOSPITAL_COMMUNITY): Payer: Self-pay

## 2013-10-06 ENCOUNTER — Inpatient Hospital Stay (HOSPITAL_COMMUNITY): Payer: Medicaid Other

## 2013-10-06 ENCOUNTER — Inpatient Hospital Stay (HOSPITAL_COMMUNITY)
Admission: AD | Admit: 2013-10-06 | Discharge: 2013-10-09 | DRG: 394 | Disposition: A | Discharge status: Home / Self Care | Payer: Medicaid Other | Source: Ambulatory Visit | Attending: Obstetrics & Gynecology | Admitting: Obstetrics & Gynecology

## 2013-10-06 ENCOUNTER — Telehealth: Payer: Self-pay | Admitting: *Deleted

## 2013-10-06 DIAGNOSIS — N9989 Other postprocedural complications and disorders of genitourinary system: Secondary | ICD-10-CM | POA: Diagnosis present

## 2013-10-06 DIAGNOSIS — R112 Nausea with vomiting, unspecified: Secondary | ICD-10-CM

## 2013-10-06 DIAGNOSIS — N179 Acute kidney failure, unspecified: Secondary | ICD-10-CM | POA: Diagnosis present

## 2013-10-06 DIAGNOSIS — K567 Ileus, unspecified: Secondary | ICD-10-CM | POA: Diagnosis present

## 2013-10-06 DIAGNOSIS — Z9889 Other specified postprocedural states: Principal | ICD-10-CM

## 2013-10-06 DIAGNOSIS — IMO0002 Reserved for concepts with insufficient information to code with codable children: Secondary | ICD-10-CM | POA: Diagnosis present

## 2013-10-06 DIAGNOSIS — K929 Disease of digestive system, unspecified: Principal | ICD-10-CM | POA: Diagnosis present

## 2013-10-06 DIAGNOSIS — K56609 Unspecified intestinal obstruction, unspecified as to partial versus complete obstruction: Secondary | ICD-10-CM | POA: Diagnosis present

## 2013-10-06 DIAGNOSIS — R109 Unspecified abdominal pain: Secondary | ICD-10-CM | POA: Diagnosis present

## 2013-10-06 DIAGNOSIS — Z87440 Personal history of urinary (tract) infections: Secondary | ICD-10-CM | POA: Diagnosis not present

## 2013-10-06 DIAGNOSIS — K9189 Other postprocedural complications and disorders of digestive system: Secondary | ICD-10-CM

## 2013-10-06 DIAGNOSIS — D72829 Elevated white blood cell count, unspecified: Secondary | ICD-10-CM | POA: Diagnosis present

## 2013-10-06 DIAGNOSIS — K560 Paralytic ileus: Secondary | ICD-10-CM | POA: Diagnosis present

## 2013-10-06 LAB — CBC WITH DIFFERENTIAL/PLATELET
Basophils Absolute: 0 K/uL (ref 0.0–0.1)
Basophils Relative: 0 % (ref 0–1)
Eosinophils Absolute: 0 K/uL (ref 0.0–0.7)
Eosinophils Relative: 0 % (ref 0–5)
HCT: 26.4 % — ABNORMAL LOW (ref 36.0–46.0)
Hemoglobin: 8.5 g/dL — ABNORMAL LOW (ref 12.0–15.0)
Lymphocytes Relative: 7 % — ABNORMAL LOW (ref 12–46)
Lymphs Abs: 1 K/uL (ref 0.7–4.0)
MCH: 27.7 pg (ref 26.0–34.0)
MCHC: 32.2 g/dL (ref 30.0–36.0)
MCV: 86 fL (ref 78.0–100.0)
Monocytes Absolute: 1.1 K/uL — ABNORMAL HIGH (ref 0.1–1.0)
Monocytes Relative: 7 % (ref 3–12)
Neutro Abs: 13 K/uL — ABNORMAL HIGH (ref 1.7–7.7)
Neutrophils Relative %: 86 % — ABNORMAL HIGH (ref 43–77)
Platelets: 402 K/uL — ABNORMAL HIGH (ref 150–400)
RBC: 3.07 MIL/uL — ABNORMAL LOW (ref 3.87–5.11)
RDW: 15.5 % (ref 11.5–15.5)
WBC: 15.1 K/uL — ABNORMAL HIGH (ref 4.0–10.5)

## 2013-10-06 LAB — COMPREHENSIVE METABOLIC PANEL WITH GFR
ALT: 7 U/L (ref 0–35)
AST: 16 U/L (ref 0–37)
Albumin: 3.1 g/dL — ABNORMAL LOW (ref 3.5–5.2)
Alkaline Phosphatase: 56 U/L (ref 39–117)
Anion gap: 10 (ref 5–15)
BUN: 13 mg/dL (ref 6–23)
CO2: 32 meq/L (ref 19–32)
Calcium: 9 mg/dL (ref 8.4–10.5)
Chloride: 95 meq/L — ABNORMAL LOW (ref 96–112)
Creatinine, Ser: 0.78 mg/dL (ref 0.50–1.10)
GFR calc Af Amer: >90 mL/min
GFR calc non Af Amer: >90 mL/min
Glucose, Bld: 105 mg/dL — ABNORMAL HIGH (ref 70–99)
Potassium: 3.5 meq/L — ABNORMAL LOW (ref 3.7–5.3)
Sodium: 137 meq/L (ref 137–147)
Total Bilirubin: 0.5 mg/dL (ref 0.3–1.2)
Total Protein: 6.2 g/dL (ref 6.0–8.3)

## 2013-10-06 LAB — URINE CULTURE: Colony Count: >=100000

## 2013-10-06 LAB — SAMPLE TO BLOOD BANK

## 2013-10-06 LAB — HCG, QUANTITATIVE, PREGNANCY: hCG, Beta Chain, Quant, S: 3281 m[IU]/mL — ABNORMAL HIGH

## 2013-10-06 MED ORDER — PROMETHAZINE HCL 25 MG PO TABS: 25.0000 mg | ORAL_TABLET | Freq: Four times a day (QID) | ORAL | Status: DC | PRN

## 2013-10-06 MED ORDER — ONDANSETRON 8 MG/NS 50 ML IVPB
8.0000 mg | Freq: Once | INTRAVENOUS | Status: AC
  Administered 2013-10-06: 8 mg via INTRAVENOUS
  Filled 2013-10-06: qty 8

## 2013-10-06 MED ORDER — PROMETHAZINE HCL 25 MG/ML IJ SOLN
12.5000 mg | Freq: Once | INTRAMUSCULAR | Status: AC
  Administered 2013-10-06: 12.5 mg via INTRAVENOUS
  Filled 2013-10-06: qty 1

## 2013-10-06 MED ORDER — HYDROMORPHONE HCL PF 1 MG/ML IJ SOLN
0.5000 mg | INTRAMUSCULAR | Status: DC | PRN
  Administered 2013-10-06 – 2013-10-08 (×2): 0.5 mg via INTRAVENOUS
  Filled 2013-10-06 (×4): qty 1

## 2013-10-06 MED ORDER — HYDROMORPHONE HCL PF 1 MG/ML IJ SOLN
0.5000 mg | Freq: Once | INTRAMUSCULAR | Status: AC
  Administered 2013-10-06: 0.5 mg via INTRAVENOUS
  Filled 2013-10-06: qty 1

## 2013-10-06 MED ORDER — HYDROMORPHONE HCL PF 1 MG/ML IJ SOLN
1.0000 mg | INTRAMUSCULAR | Status: DC | PRN
  Administered 2013-10-06 – 2013-10-07 (×7): 1 mg via INTRAVENOUS
  Filled 2013-10-06 (×6): qty 1

## 2013-10-06 MED ORDER — DEXTROSE IN LACTATED RINGERS 5 % IV SOLN
INTRAVENOUS | Status: DC
  Administered 2013-10-06: 10:00:00 via INTRAVENOUS

## 2013-10-06 MED ORDER — PRENATAL MULTIVITAMIN CH
1.0000 | ORAL_TABLET | Freq: Every day | ORAL | Status: DC
  Filled 2013-10-06: qty 1

## 2013-10-06 MED ORDER — KCL-LACTATED RINGERS-D5W 20 MEQ/L IV SOLN
INTRAVENOUS | Status: DC
  Administered 2013-10-06 – 2013-10-08 (×9): via INTRAVENOUS
  Filled 2013-10-06 (×17): qty 1000

## 2013-10-06 NOTE — MAU Provider Note (Signed)
History     CSN: [id number]  Arrival date and time: 10/06/13 4098   None     Chief Complaint  Patient presents with  . Post-op Problem  . Emesis   Emesis  Associated symptoms include abdominal pain and chills. Pertinent negatives include no fever.    Carolyn Pierce is a 33 y.o. female [phone] who presents to MAU with worsening abdominal pain and emesis following a Diagnostic laparoscopy, Exploratory laparotomy, Right Salpingoohorectomy on 8/16 for a right ovarian torsion. The patient presented to MAU on 8/16 with severe abdominal pain following a TAB she had done the previous week. She was found to have a small ileus, acute renal failure. MRI confirmed the torsion. The patient was seen by general surgery and operated on by Dr. Catalina Antigua. The patient was sent home the same day. She states she began vomiting bile yesterday; and states her abdomen is large and hard.    OB History   Grav Para Term Preterm Abortions TAB SAB Ect Mult Living   5 2 1 1 3 3           Past Medical History  Diagnosis Date  . Acne   . Essential tremor     Dr. Terrace Arabia, neurology  . History of abnormal Pap smear   . History of UTI   . History of trichomonal vaginitis   . Menorrhagia     prior trials of Depo Provera, Ortho Tri Cyclen Lo  . Anemia     hx/o iron deficiency anemia  . Yeast vaginitis     recurrent    Past Surgical History  Procedure Laterality Date  . Appendectomy    . Wisdom tooth extraction    . Dilation and evacuation    . Laparoscopic unilateral salpingo oopherectomy      History reviewed. No pertinent family history.  History  Substance Use Topics  . Smoking status: Never Smoker   . Smokeless tobacco: Never Used  . Alcohol Use: No     Comment: occ    Allergies: No Known Allergies  Prescriptions prior to admission  Medication Sig Dispense Refill  . oxyCODONE-acetaminophen (PERCOCET/ROXICET) 5-325 MG per tablet Take 1-2 tablets by mouth every 4 (four) hours as  needed for severe pain.  30 tablet  0  . propranolol (INDERAL) 20 MG tablet Take 1 tablet (20 mg total) by mouth daily.  60 tablet  12  . vitamin B-12 (CYANOCOBALAMIN) 1000 MCG tablet Take 1,000 mcg by mouth daily.       Results for orders placed during the hospital encounter of 10/06/13 (from the past 24 hour(s))  CBC WITH DIFFERENTIAL     Status: Abnormal   Collection Time    10/06/13  9:37 AM      Result Value Ref Range   WBC 15.1 (*) 4.0 - 10.5 K/uL   RBC 3.07 (*) 3.87 - 5.11 MIL/uL   Hemoglobin 8.5 (*) 12.0 - 15.0 g/dL   HCT 29.5 (*) 62.1 - 30.8 %   MCV 86.0  78.0 - 100.0 fL   MCH 27.7  26.0 - 34.0 pg   MCHC 32.2  30.0 - 36.0 g/dL   RDW 65.7  84.6 - 96.2 %   Platelets 402 (*) 150 - 400 K/uL   Neutrophils Relative % 86 (*) 43 - 77 %   Neutro Abs 13.0 (*) 1.7 - 7.7 K/uL   Lymphocytes Relative 7 (*) 12 - 46 %   Lymphs Abs 1.0  0.7 - 4.0  K/uL   Monocytes Relative 7  3 - 12 %   Monocytes Absolute 1.1 (*) 0.1 - 1.0 K/uL   Eosinophils Relative 0  0 - 5 %   Eosinophils Absolute 0.0  0.0 - 0.7 K/uL   Basophils Relative 0  0 - 1 %   Basophils Absolute 0.0  0.0 - 0.1 K/uL  SAMPLE TO BLOOD BANK     Status: None   Collection Time    10/06/13  9:38 AM      Result Value Ref Range   Blood Bank Specimen SAMPLE AVAILABLE FOR TESTING     Sample Expiration 10/09/2013    HCG, QUANTITATIVE, PREGNANCY     Status: Abnormal   Collection Time    10/06/13  9:43 AM      Result Value Ref Range   hCG, Beta Chain, Quant, S 3281 (*) <5 mIU/mL  COMPREHENSIVE METABOLIC PANEL     Status: Abnormal   Collection Time    10/06/13  9:44 AM      Result Value Ref Range   Sodium 137  137 - 147 mEq/L   Potassium 3.5 (*) 3.7 - 5.3 mEq/L   Chloride 95 (*) 96 - 112 mEq/L   CO2 32  19 - 32 mEq/L   Glucose, Bld 105 (*) 70 - 99 mg/dL   BUN 13  6 - 23 mg/dL   Creatinine, Ser 3.08  0.50 - 1.10 mg/dL   Calcium 9.0  8.4 - 65.7 mg/dL   Total Protein 6.2  6.0 - 8.3 g/dL   Albumin 3.1 (*) 3.5 - 5.2 g/dL   AST 16  0  - 37 U/L   ALT 7  0 - 35 U/L   Alkaline Phosphatase 56  39 - 117 U/L   Total Bilirubin 0.5  0.3 - 1.2 mg/dL   GFR calc non Af Amer >90  >90 mL/min   GFR calc Af Amer >90  >90 mL/min   Anion gap 10  5 - 15   Mr Pelvis W Wo Contrast  10/04/2013   CLINICAL DATA:  Anterior pelvic mass. Concern for ovarian torsion. Small bowel ileus  EXAM: MRI PELVIS WITHOUT AND WITH CONTRAST  TECHNIQUE: Multiplanar multisequence MR imaging of the pelvis was performed both before and after administration of intravenous contrast.  CONTRAST:  10 cc MultiHance  COMPARISON:  CT 09/29/2013, ultrasound 10/04/2013  FINDINGS: The large ovoid mass is again demonstrated anterior to the uterus measuring 9.6 x 5.5 cm and axial dimension approximately 7.5 cm in craniocaudad dimension. This lesion has multiple round follicle type structures in the periphery which are hyperintense on T2 weighted imaging most suggestive of small peripheral follicles of a massively enlarged right ovary. The central component of the presumed a large ovary is high single on T2 weighted imaging measuring 4.4 cm and low signal on T1 weighted imaging consists with a cysts. This could represent corpus luteal cyst. There is a tubular structure leading up to the ovary extend to the cornual region of the uterus percent tibia and enlarged engorged right fallopian tube. No significant enhancement of the right ovary on the the post-contrast images.  The left ovary is identified and normal measuring 2.6 x 1.7 cm on coronal image 16, series 6 and 1.4 cm axial dimension on image 50 of series 4.  There is a moderate volume of free fluid in the pelvis with a fluid fluid level likely representing some component hemorrhage (image 18, series 4.  Again demonstrated dilated loops of  small bowel.  IMPRESSION: 1. Large ovoid mass anterior to the uterus is most consistent with an enlarged torsed right ovary. The right fallopian tube is edematous and enlarged. No evidence of  post-contrast enhancement of the right ovary. 2. Large cyst within the presumed torsed right ovary. 3. Mild amount of intraperitoneal free with a fluid fluid level suggests component of blood product. 4. Small bowel ileus. 5. Normal left ovary. Findings conveyed toPEGGY CONSTANT on 10/04/2013  at20:30.   Electronically Signed   By: Genevive Bi M.D.   On: 10/04/2013 20:32   US Ob Comp Less 14 Wks  10/04/2013   ADDENDUM REPORT: 10/04/2013 13:01  ADDENDUM: Correction: Please disregard the prior report. See the report below.  Intrauterine gestational sac: None  Yolk sac: None  Embryo: None  Cardiac Activity: None  Maternal uterus/adnexae: The uterus appears normal. No visible retained products of conception.  There is a complex mixed solid and cystic 9.4 x 5.4 x 8.2 cm mass anterior and superior to the uterus. There are also dilated bowel loops of the pelvis as well as free fluid in the cul-de-sac.  The right ovary is not identified. Soft tissue density in the left adnexa most likely represents a normal left ovary.  IMPRESSION: 1. No intrauterine pregnancy or retained products of conception. 2. Complex mass anterior and superior to the uterus. This could represent an abscess, hematoma, or possibly a hemorrhagic corpus luteum cyst in a torsed right ovary. There is no perfusion to this abnormality. 3. Given this mass and the presence of dilated small bowel loops and complex fluid in the cul-de-sac and the patient's elevated Finlay blood count, I recommend further evaluation by abdominal and pelvic CT scan with intravenous and oral contrast.   Electronically Signed   By: Geanie Cooley M.D.   On: 10/04/2013 13:01   10/04/2013   CLINICAL DATA:  Abdominal pain with nausea and vomiting and ridging abdomen since D&E on 09/30/2013.  EXAM: OBSTETRIC <14 WK [place] AND TRANSVAGINAL OB US  TECHNIQUE: Both transabdominal and transvaginal ultrasound examinations were performed for complete evaluation of the gestation as well as the  maternal uterus, adnexal regions, and pelvic cul-de-sac. Transvaginal technique was performed to assess early pregnancy.  COMPARISON:  None.  FINDINGS: Intrauterine gestational sac: Visualized/normal in shape.  Yolk sac:  Embryo:  Cardiac Activity:  Heart Rate:   bpm  MSD:    mm    w     d  CRL:     mm    w  d                  [place] EDC:  Maternal uterus/adnexae:  Intrauterine gestational sac: None  Yolk sac:  None  Embryo:  None  Cardiac Activity: None  Maternal uterus/adnexae: The uterus appears normal. No visible retained products of conception.  There is a complex mixed solid and cystic 9.4 x 5.4 x 0.2 cm mass anterior and superior to the uterus. There are also dilated bowel loops of the pelvis as well as free fluid in the cul-de-sac.  The right ovary is not identified. Soft tissue density in the left adnexa most likely represents a normal left ovary.  Electronically Signed: By: Geanie Cooley M.D. On: 10/04/2013 12:44   US Ob Transvaginal  10/04/2013   ADDENDUM REPORT: 10/04/2013 13:01  ADDENDUM: Correction: Please disregard the prior report. See the report below.  Intrauterine gestational sac: None  Yolk sac: None  Embryo: None  Cardiac Activity: None  Maternal uterus/adnexae: The uterus appears normal. No visible retained products of conception.  There is a complex mixed solid and cystic 9.4 x 5.4 x 8.2 cm mass anterior and superior to the uterus. There are also dilated bowel loops of the pelvis as well as free fluid in the cul-de-sac.  The right ovary is not identified. Soft tissue density in the left adnexa most likely represents a normal left ovary.  IMPRESSION: 1. No intrauterine pregnancy or retained products of conception. 2. Complex mass anterior and superior to the uterus. This could represent an abscess, hematoma, or possibly a hemorrhagic corpus luteum cyst in a torsed right ovary. There is no perfusion to this abnormality. 3. Given this mass and the presence of dilated small bowel loops and complex  fluid in the cul-de-sac and the patient's elevated Henard blood count, I recommend further evaluation by abdominal and pelvic CT scan with intravenous and oral contrast.   Electronically Signed   By: Geanie Cooley M.D.   On: 10/04/2013 13:01   10/04/2013   CLINICAL DATA:  Abdominal pain with nausea and vomiting and ridging abdomen since D&E on 09/30/2013.  EXAM: OBSTETRIC <14 WK [place] AND TRANSVAGINAL OB US  TECHNIQUE: Both transabdominal and transvaginal ultrasound examinations were performed for complete evaluation of the gestation as well as the maternal uterus, adnexal regions, and pelvic cul-de-sac. Transvaginal technique was performed to assess early pregnancy.  COMPARISON:  None.  FINDINGS: Intrauterine gestational sac: Visualized/normal in shape.  Yolk sac:  Embryo:  Cardiac Activity:  Heart Rate:   bpm  MSD:    mm    w     d  CRL:     mm    w  d                  [place] EDC:  Maternal uterus/adnexae:  Intrauterine gestational sac: None  Yolk sac:  None  Embryo:  None  Cardiac Activity: None  Maternal uterus/adnexae: The uterus appears normal. No visible retained products of conception.  There is a complex mixed solid and cystic 9.4 x 5.4 x 0.2 cm mass anterior and superior to the uterus. There are also dilated bowel loops of the pelvis as well as free fluid in the cul-de-sac.  The right ovary is not identified. Soft tissue density in the left adnexa most likely represents a normal left ovary.  Electronically Signed: By: Geanie Cooley M.D. On: 10/04/2013 12:44   Ct Abdomen Pelvis W Contrast  10/04/2013   CLINICAL DATA:  D and C 5 days ago. Elevated beta HCG level. Prior appendectomy.  EXAM: CT ABDOMEN AND PELVIS WITH CONTRAST  TECHNIQUE: Multidetector CT imaging of the abdomen and pelvis was performed using the standard protocol following bolus administration of intravenous contrast.  CONTRAST:  OMNIPAQUE IOHEXOL 300 MG/ML  SOLN  COMPARISON:  Ultrasound of earlier today.  No prior CT.  FINDINGS: Lower Chest:  Clear lung bases. Normal heart size without pericardial or pleural effusion.  Abdomen/Pelvis: Suspicion of mild hepatic steatosis. Normal spleen, stomach, pancreas, gallbladder, biliary tract, adrenal glands.  Too small to characterize lesions in both kidneys. No retroperitoneal or retrocrural adenopathy. Relatively decompressed descending colon. Relatively normal caliber of the ascending colon.  Small bowel loops are diffusely dilated and fluid-filled. Example loops measure up to 3.2 cm including on image 56. No focal transition point identified.  Small volume perihepatic ascites. No pneumatosis or other acute complication.  Within the anterior pelvis, a mixed soft tissue and fluid density "  Mass" 6.0 x 10.0 cm on image 74 transverse. At least 9.8 cm on sagittal image 72. There may be contiguous adjacent thickened small bowel loops, including on image 43. Left ovary is felt to be positioned separate from this, including on image 70. The right ovary is not confidently identified separate from this.  Small to moderate volume complex pelvic fluid/ hemorrhage.  No pelvic adenopathy. Normal urinary bladder. Normal appearance of the uterus for recent postpartum state.  Bones/Musculoskeletal:  No acute osseous abnormality.  IMPRESSION: 1. Anterior pelvic "Mass" with complex cul-de-sac fluid/hemorrhage. This appearance is indeterminate. Favored diagnostic consideration is torsion of the right ovary. Secondary considerations include hematoma (one would expect a uterine defect from recent D and C) versus abscess from distal enteritis. Pelvic MRI may be informative (to demonstrate ovarian follicles within the anterior pelvic "Mass") . 2. Moderate ileus versus partial small bowel obstruction. This is likely secondary to the anterior pelvic wall mass" . Adjacent small bowel loops appear thickened, favored to be secondarily. 3. Small volume perihepatic ascites. 4. Mild hepatic steatosis.   Electronically Signed   By: Jeronimo GreavesKyle  Talbot  M.D.   On: 10/04/2013 16:04   Dg Abd 2 Views  10/06/2013   CLINICAL DATA:  Abdominal pain. Nausea and vomiting. Recent surgery for ovarian torsion.  EXAM: ABDOMEN - 2 VIEW  COMPARISON:  Multiple priors.  FINDINGS: Distended loops of small bowel are seen in the mid abdomen with scattered air-fluid levels. There is a paucity of colonic gas. Fluid-filled loops are noted in the LEFT lower quadrant. Findings concerning for small bowel obstruction. Similar appearance to prior CT.  IMPRESSION: Findings concerning for small bowel obstruction.   Electronically Signed   By: Davonna BellingJohn  Curnes M.D.   On: 10/06/2013 10:59    Review of Systems  Constitutional: Positive for chills. Negative for fever.  Gastrointestinal: Positive for nausea, vomiting and abdominal pain.   Physical Exam   Blood pressure 121/81, pulse 92, temperature 98.6 F (37 C), temperature source Oral, resp. rate 20, SpO2 100.00%.  Physical Exam  Constitutional: She is oriented to person, place, and time. She appears well-developed. She appears toxic. She has a sickly appearance. She appears ill. No distress.  HENT:  Head: Normocephalic.  Eyes: Pupils are equal, round, and reactive to light.  Respiratory: Effort normal.  GI: She exhibits distension and ascites. There is tenderness. There is guarding. There is no rebound.  Musculoskeletal: Normal range of motion.  Neurological: She is alert and oriented to person, place, and time.  Skin: Skin is warm. She is diaphoretic.  Psychiatric: Her behavior is normal.    MAU Course  Procedures None  MDM Dr. Marice Potterove notified of patients arrival. OP noted reviewed, orders received.  CBC with diff CMP Hcg Abdominal Xray D5LR at 200 ml/hr Phenergan IV Zofran IV Dilaudid IV   Assessment and Plan   A:  Bowel obstruction  Leukocytosis  Nausea and vomiting  P: Admit to the hospital per Dr. Marice Potterove.   Iona HansenJennifer Irene Rasch, NP 10/06/2013, 11:29 AM

## 2013-10-06 NOTE — MAU Note (Signed)
Pt states had surgery Saturday, left Sunday pm. Began feeling nauseated through the night then this am vomited bile. Feels like something in her abdomen popped. Abdomen very firm to palpation. Back pain.

## 2013-10-06 NOTE — MAU Note (Signed)
Patient states she has had vomiting and increasing abdominal pain since this am. Had surgery on 8-15 for ovarian torsion.

## 2013-10-06 NOTE — H&P (Signed)
History     CSN: [id number]  Arrival date and time: 10/06/13 4098   None     Chief Complaint  Patient presents with  . Post-op Problem  . Emesis   Emesis  Associated symptoms include abdominal pain and chills. Pertinent negatives include no fever.    Carolyn Pierce is a 33 y.o. female [phone] who presents to [facility] with worsening abdominal pain and emesis following a Diagnostic laparoscopy, Exploratory laparotomy, Right Salpingoohorectomy on 8/16 for a right ovarian torsion. The patient presented to [facility] on 8/16 with severe abdominal pain following a TAB she had done the previous week. She was found to have a small ileus, acute renal failure. MRI confirmed the torsion. The patient was seen by general surgery and operated on by Dr. Catalina Antigua. The patient was sent home the same day. She states she began vomiting bile yesterday; and states her abdomen is large and hard.    OB History   Grav Para Term Preterm Abortions TAB SAB Ect Mult Living   5 2 1 1 3 3           Past Medical History  Diagnosis Date  . Acne   . Essential tremor     Dr. Terrace Arabia, neurology  . History of abnormal Pap smear   . History of UTI   . History of trichomonal vaginitis   . Menorrhagia     prior trials of Depo Provera, Ortho Tri Cyclen Lo  . Anemia     hx/o iron deficiency anemia  . Yeast vaginitis     recurrent    Past Surgical History  Procedure Laterality Date  . Appendectomy    . Wisdom tooth extraction    . Dilation and evacuation    . Laparoscopic unilateral salpingo oopherectomy      History reviewed. No pertinent family history.  History  Substance Use Topics  . Smoking status: Never Smoker   . Smokeless tobacco: Never Used  . Alcohol Use: No     Comment: occ    Allergies: No Known Allergies  Prescriptions prior to admission  Medication Sig Dispense Refill  . oxyCODONE-acetaminophen (PERCOCET/ROXICET) 5-325 MG per tablet Take 1-2 tablets by mouth every 4 (four) hours as  needed for severe pain.  30 tablet  0  . propranolol (INDERAL) 20 MG tablet Take 1 tablet (20 mg total) by mouth daily.  60 tablet  12  . vitamin B-12 (CYANOCOBALAMIN) 1000 MCG tablet Take 1,000 mcg by mouth daily.       Results for orders placed during the hospital encounter of 10/06/13 (from the past 24 hour(s))  CBC WITH DIFFERENTIAL     Status: Abnormal   Collection Time    10/06/13  9:37 AM      Result Value Ref Range   WBC 15.1 (*) 4.0 - 10.5 K/uL   RBC 3.07 (*) 3.87 - 5.11 MIL/uL   Hemoglobin 8.5 (*) 12.0 - 15.0 g/dL   HCT 29.5 (*) 62.1 - 30.8 %   MCV 86.0  78.0 - 100.0 fL   MCH 27.7  26.0 - 34.0 pg   MCHC 32.2  30.0 - 36.0 g/dL   RDW 65.7  84.6 - 96.2 %   Platelets 402 (*) 150 - 400 K/uL   Neutrophils Relative % 86 (*) 43 - 77 %   Neutro Abs 13.0 (*) 1.7 - 7.7 K/uL   Lymphocytes Relative 7 (*) 12 - 46 %   Lymphs Abs 1.0  0.7 - 4.0  K/uL   Monocytes Relative 7  3 - 12 %   Monocytes Absolute 1.1 (*) 0.1 - 1.0 K/uL   Eosinophils Relative 0  0 - 5 %   Eosinophils Absolute 0.0  0.0 - 0.7 K/uL   Basophils Relative 0  0 - 1 %   Basophils Absolute 0.0  0.0 - 0.1 K/uL  SAMPLE TO BLOOD BANK     Status: None   Collection Time    10/06/13  9:38 AM      Result Value Ref Range   Blood Bank Specimen SAMPLE AVAILABLE FOR TESTING     Sample Expiration 10/09/2013    HCG, QUANTITATIVE, PREGNANCY     Status: Abnormal   Collection Time    10/06/13  9:43 AM      Result Value Ref Range   hCG, Beta Chain, Quant, S 3281 (*) <5 mIU/mL  COMPREHENSIVE METABOLIC PANEL     Status: Abnormal   Collection Time    10/06/13  9:44 AM      Result Value Ref Range   Sodium 137  137 - 147 mEq/L   Potassium 3.5 (*) 3.7 - 5.3 mEq/L   Chloride 95 (*) 96 - 112 mEq/L   CO2 32  19 - 32 mEq/L   Glucose, Bld 105 (*) 70 - 99 mg/dL   BUN 13  6 - 23 mg/dL   Creatinine, Ser 3.08  0.50 - 1.10 mg/dL   Calcium 9.0  8.4 - 65.7 mg/dL   Total Protein 6.2  6.0 - 8.3 g/dL   Albumin 3.1 (*) 3.5 - 5.2 g/dL   AST 16  0  - 37 U/L   ALT 7  0 - 35 U/L   Alkaline Phosphatase 56  39 - 117 U/L   Total Bilirubin 0.5  0.3 - 1.2 mg/dL   GFR calc non Af Amer >90  >90 mL/min   GFR calc Af Amer >90  >90 mL/min   Anion gap 10  5 - 15   Mr Pelvis W Wo Contrast  10/04/2013   CLINICAL DATA:  Anterior pelvic mass. Concern for ovarian torsion. Small bowel ileus  EXAM: MRI PELVIS WITHOUT AND WITH CONTRAST  TECHNIQUE: Multiplanar multisequence MR imaging of the pelvis was performed both before and after administration of intravenous contrast.  CONTRAST:  10 cc MultiHance  COMPARISON:  CT 09/29/2013, ultrasound 10/04/2013  FINDINGS: The large ovoid mass is again demonstrated anterior to the uterus measuring 9.6 x 5.5 cm and axial dimension approximately 7.5 cm in craniocaudad dimension. This lesion has multiple round follicle type structures in the periphery which are hyperintense on T2 weighted imaging most suggestive of small peripheral follicles of a massively enlarged right ovary. The central component of the presumed a large ovary is high single on T2 weighted imaging measuring 4.4 cm and low signal on T1 weighted imaging consists with a cysts. This could represent corpus luteal cyst. There is a tubular structure leading up to the ovary extend to the cornual region of the uterus percent tibia and enlarged engorged right fallopian tube. No significant enhancement of the right ovary on the the post-contrast images.  The left ovary is identified and normal measuring 2.6 x 1.7 cm on coronal image 16, series 6 and 1.4 cm axial dimension on image 50 of series 4.  There is a moderate volume of free fluid in the pelvis with a fluid fluid level likely representing some component hemorrhage (image 18, series 4.  Again demonstrated dilated loops of  small bowel.  IMPRESSION: 1. Large ovoid mass anterior to the uterus is most consistent with an enlarged torsed right ovary. The right fallopian tube is edematous and enlarged. No evidence of  post-contrast enhancement of the right ovary. 2. Large cyst within the presumed torsed right ovary. 3. Mild amount of intraperitoneal free with a fluid fluid level suggests component of blood product. 4. Small bowel ileus. 5. Normal left ovary. Findings conveyed toPEGGY CONSTANT on 10/04/2013  at20:30.   Electronically Signed   By: Genevive Bi M.D.   On: 10/04/2013 20:32   US Ob Comp Less 14 Wks  10/04/2013   ADDENDUM REPORT: 10/04/2013 13:01  ADDENDUM: Correction: Please disregard the prior report. See the report below.  Intrauterine gestational sac: None  Yolk sac: None  Embryo: None  Cardiac Activity: None  Maternal uterus/adnexae: The uterus appears normal. No visible retained products of conception.  There is a complex mixed solid and cystic 9.4 x 5.4 x 8.2 cm mass anterior and superior to the uterus. There are also dilated bowel loops of the pelvis as well as free fluid in the cul-de-sac.  The right ovary is not identified. Soft tissue density in the left adnexa most likely represents a normal left ovary.  IMPRESSION: 1. No intrauterine pregnancy or retained products of conception. 2. Complex mass anterior and superior to the uterus. This could represent an abscess, hematoma, or possibly a hemorrhagic corpus luteum cyst in a torsed right ovary. There is no perfusion to this abnormality. 3. Given this mass and the presence of dilated small bowel loops and complex fluid in the cul-de-sac and the patient's elevated Bi blood count, I recommend further evaluation by abdominal and pelvic CT scan with intravenous and oral contrast.   Electronically Signed   By: Geanie Cooley M.D.   On: 10/04/2013 13:01   10/04/2013   CLINICAL DATA:  Abdominal pain with nausea and vomiting and ridging abdomen since D&E on 09/30/2013.  EXAM: OBSTETRIC <14 WK [place] AND TRANSVAGINAL OB US  TECHNIQUE: Both transabdominal and transvaginal ultrasound examinations were performed for complete evaluation of the gestation as well as the  maternal uterus, adnexal regions, and pelvic cul-de-sac. Transvaginal technique was performed to assess early pregnancy.  COMPARISON:  None.  FINDINGS: Intrauterine gestational sac: Visualized/normal in shape.  Yolk sac:  Embryo:  Cardiac Activity:  Heart Rate:   bpm  MSD:    mm    w     d  CRL:     mm    w  d                  [place] EDC:  Maternal uterus/adnexae:  Intrauterine gestational sac: None  Yolk sac:  None  Embryo:  None  Cardiac Activity: None  Maternal uterus/adnexae: The uterus appears normal. No visible retained products of conception.  There is a complex mixed solid and cystic 9.4 x 5.4 x 0.2 cm mass anterior and superior to the uterus. There are also dilated bowel loops of the pelvis as well as free fluid in the cul-de-sac.  The right ovary is not identified. Soft tissue density in the left adnexa most likely represents a normal left ovary.  Electronically Signed: By: Geanie Cooley M.D. On: 10/04/2013 12:44   US Ob Transvaginal  10/04/2013   ADDENDUM REPORT: 10/04/2013 13:01  ADDENDUM: Correction: Please disregard the prior report. See the report below.  Intrauterine gestational sac: None  Yolk sac: None  Embryo: None  Cardiac Activity: None  Maternal uterus/adnexae: The uterus appears normal. No visible retained products of conception.  There is a complex mixed solid and cystic 9.4 x 5.4 x 8.2 cm mass anterior and superior to the uterus. There are also dilated bowel loops of the pelvis as well as free fluid in the cul-de-sac.  The right ovary is not identified. Soft tissue density in the left adnexa most likely represents a normal left ovary.  IMPRESSION: 1. No intrauterine pregnancy or retained products of conception. 2. Complex mass anterior and superior to the uterus. This could represent an abscess, hematoma, or possibly a hemorrhagic corpus luteum cyst in a torsed right ovary. There is no perfusion to this abnormality. 3. Given this mass and the presence of dilated small bowel loops and complex  fluid in the cul-de-sac and the patient's elevated Conchas blood count, I recommend further evaluation by abdominal and pelvic CT scan with intravenous and oral contrast.   Electronically Signed   By: Geanie Cooley M.D.   On: 10/04/2013 13:01   10/04/2013   CLINICAL DATA:  Abdominal pain with nausea and vomiting and ridging abdomen since D&E on 09/30/2013.  EXAM: OBSTETRIC <14 WK [place] AND TRANSVAGINAL OB US  TECHNIQUE: Both transabdominal and transvaginal ultrasound examinations were performed for complete evaluation of the gestation as well as the maternal uterus, adnexal regions, and pelvic cul-de-sac. Transvaginal technique was performed to assess early pregnancy.  COMPARISON:  None.  FINDINGS: Intrauterine gestational sac: Visualized/normal in shape.  Yolk sac:  Embryo:  Cardiac Activity:  Heart Rate:   bpm  MSD:    mm    w     d  CRL:     mm    w  d                  [place] EDC:  Maternal uterus/adnexae:  Intrauterine gestational sac: None  Yolk sac:  None  Embryo:  None  Cardiac Activity: None  Maternal uterus/adnexae: The uterus appears normal. No visible retained products of conception.  There is a complex mixed solid and cystic 9.4 x 5.4 x 0.2 cm mass anterior and superior to the uterus. There are also dilated bowel loops of the pelvis as well as free fluid in the cul-de-sac.  The right ovary is not identified. Soft tissue density in the left adnexa most likely represents a normal left ovary.  Electronically Signed: By: Geanie Cooley M.D. On: 10/04/2013 12:44   Ct Abdomen Pelvis W Contrast  10/04/2013   CLINICAL DATA:  D and C 5 days ago. Elevated beta HCG level. Prior appendectomy.  EXAM: CT ABDOMEN AND PELVIS WITH CONTRAST  TECHNIQUE: Multidetector CT imaging of the abdomen and pelvis was performed using the standard protocol following bolus administration of intravenous contrast.  CONTRAST:  OMNIPAQUE IOHEXOL 300 MG/ML  SOLN  COMPARISON:  Ultrasound of earlier today.  No prior CT.  FINDINGS: Lower Chest:  Clear lung bases. Normal heart size without pericardial or pleural effusion.  Abdomen/Pelvis: Suspicion of mild hepatic steatosis. Normal spleen, stomach, pancreas, gallbladder, biliary tract, adrenal glands.  Too small to characterize lesions in both kidneys. No retroperitoneal or retrocrural adenopathy. Relatively decompressed descending colon. Relatively normal caliber of the ascending colon.  Small bowel loops are diffusely dilated and fluid-filled. Example loops measure up to 3.2 cm including on image 56. No focal transition point identified.  Small volume perihepatic ascites. No pneumatosis or other acute complication.  Within the anterior pelvis, a mixed soft tissue and fluid density "  Mass" 6.0 x 10.0 cm on image 74 transverse. At least 9.8 cm on sagittal image 72. There may be contiguous adjacent thickened small bowel loops, including on image 43. Left ovary is felt to be positioned separate from this, including on image 70. The right ovary is not confidently identified separate from this.  Small to moderate volume complex pelvic fluid/ hemorrhage.  No pelvic adenopathy. Normal urinary bladder. Normal appearance of the uterus for recent postpartum state.  Bones/Musculoskeletal:  No acute osseous abnormality.  IMPRESSION: 1. Anterior pelvic "Mass" with complex cul-de-sac fluid/hemorrhage. This appearance is indeterminate. Favored diagnostic consideration is torsion of the right ovary. Secondary considerations include hematoma (one would expect a uterine defect from recent D and C) versus abscess from distal enteritis. Pelvic MRI may be informative (to demonstrate ovarian follicles within the anterior pelvic "Mass") . 2. Moderate ileus versus partial small bowel obstruction. This is likely secondary to the anterior pelvic wall mass" . Adjacent small bowel loops appear thickened, favored to be secondarily. 3. Small volume perihepatic ascites. 4. Mild hepatic steatosis.   Electronically Signed   By: Jeronimo GreavesKyle  Talbot  M.D.   On: 10/04/2013 16:04   Dg Abd 2 Views  10/06/2013   CLINICAL DATA:  Abdominal pain. Nausea and vomiting. Recent surgery for ovarian torsion.  EXAM: ABDOMEN - 2 VIEW  COMPARISON:  Multiple priors.  FINDINGS: Distended loops of small bowel are seen in the mid abdomen with scattered air-fluid levels. There is a paucity of colonic gas. Fluid-filled loops are noted in the LEFT lower quadrant. Findings concerning for small bowel obstruction. Similar appearance to prior CT.  IMPRESSION: Findings concerning for small bowel obstruction.   Electronically Signed   By: Davonna BellingJohn  Curnes M.D.   On: 10/06/2013 10:59    Review of Systems  Constitutional: Positive for chills. Negative for fever.  Gastrointestinal: Positive for nausea, vomiting and abdominal pain.   Physical Exam   Blood pressure 121/81, pulse 92, temperature 98.6 F (37 C), temperature source Oral, resp. rate 20, SpO2 100.00%.  Physical Exam  Constitutional: She is oriented to person, place, and time. She appears well-developed. She appears toxic. She has a sickly appearance. She appears ill. No distress.  HENT:  Head: Normocephalic.  Eyes: Pupils are equal, round, and reactive to light.  Respiratory: Effort normal.  GI: She exhibits distension and ascites. There is tenderness. There is guarding. There is no rebound.  Musculoskeletal: Normal range of motion.  Neurological: She is alert and oriented to person, place, and time.  Skin: Skin is warm. She is diaphoretic.  Psychiatric: Her behavior is normal.    MAU Course  Procedures None  MDM Dr. Marice Potterove notified of patients arrival. OP noted reviewed, orders received.  CBC with diff CMP Hcg Abdominal Xray D5LR at 200 ml/hr Phenergan IV Zofran IV Dilaudid IV   Assessment and Plan   A:  Bowel obstruction  Leukocytosis  Nausea and vomiting  P: Admit to the hospital per Dr. Marice Potterove.   Iona HansenJennifer Irene Rasch, NP 10/06/2013, 11:29 AM

## 2013-10-06 NOTE — Telephone Encounter (Signed)
Patient called and stated that she had surgery yesterday with Dr. Jolayne Pantheronstant. She is experiencing nausea and vomiting today as well as a lot of uncomfortable bloating. She would like to know what she can do about it. Spoke with Dr. Jolayne Pantheronstant and she gave verbal order for Phenergan 25mg  every 6 hours as needed for nausea and vomiting. Also gave order for mylicon. I called patient and she stated that she already has mylicon so i wouldn't need to send that one in. Phenergan sent to pharmacy. Also advised patient to be up moving around to help with gas and bloating. Patient agreeable to this.

## 2013-10-06 NOTE — Progress Notes (Signed)
UR completed 

## 2013-10-06 NOTE — MAU Note (Signed)
Dr. Marice Potterove at bedside

## 2013-10-06 NOTE — MAU Provider Note (Signed)
History     CSN: [id number]  Arrival date and time: 10/06/13 4098   None     Chief Complaint  Patient presents with  . Post-op Problem  . Emesis   HPI  Carolyn Pierce is a 33 y.o. female [id number] who presents to MAU with worsening abdominal pain and emesis following a Diagnostic laparoscopy, Exploratory laparotomy, Right Salpingoohorectomy on 8/16 for a right ovarian torsion. The patient presented to MAU on 8/16 with severe abdominal pain following a TAB she had done the previous week. She was found to have a small ileus, acute renal failure. MRI confirmed the torsion. The patient was seen by general surgery and operated on by Dr. Catalina Antigua. The patient was sent home the same day. She states she began vomiting bile yesterday; and states her abdomen is large and hard.    OB History   Grav Para Term Preterm Abortions TAB SAB Ect Mult Living   5 2 1 1 3 3           Past Medical History  Diagnosis Date  . Acne   . Essential tremor     Dr. Terrace Arabia, neurology  . History of abnormal Pap smear   . History of UTI   . History of trichomonal vaginitis   . Menorrhagia     prior trials of Depo Provera, Ortho Tri Cyclen Lo  . Anemia     hx/o iron deficiency anemia  . Yeast vaginitis     recurrent    Past Surgical History  Procedure Laterality Date  . Appendectomy    . Wisdom tooth extraction    . Dilation and evacuation    . Laparoscopic unilateral salpingo oopherectomy      History reviewed. No pertinent family history.  History  Substance Use Topics  . Smoking status: Never Smoker   . Smokeless tobacco: Never Used  . Alcohol Use: No     Comment: occ    Allergies: No Known Allergies  Prescriptions prior to admission  Medication Sig Dispense Refill  . oxyCODONE-acetaminophen (PERCOCET/ROXICET) 5-325 MG per tablet Take 1-2 tablets by mouth every 4 (four) hours as needed for severe pain.  30 tablet  0  . propranolol (INDERAL) 20 MG tablet Take 1 tablet (20 mg  total) by mouth daily.  60 tablet  12  . vitamin B-12 (CYANOCOBALAMIN) 1000 MCG tablet Take 1,000 mcg by mouth daily.       Results for orders placed during the hospital encounter of 10/06/13 (from the past 24 hour(s))  CBC WITH DIFFERENTIAL     Status: Abnormal   Collection Time    10/06/13  9:37 AM      Result Value Ref Range   WBC 15.1 (*) 4.0 - 10.5 K/uL   RBC 3.07 (*) 3.87 - 5.11 MIL/uL   Hemoglobin 8.5 (*) 12.0 - 15.0 g/dL   HCT 29.5 (*) 62.1 - 30.8 %   MCV 86.0  78.0 - 100.0 fL   MCH 27.7  26.0 - 34.0 pg   MCHC 32.2  30.0 - 36.0 g/dL   RDW 65.7  84.6 - 96.2 %   Platelets 402 (*) 150 - 400 K/uL   Neutrophils Relative % 86 (*) 43 - 77 %   Neutro Abs 13.0 (*) 1.7 - 7.7 K/uL   Lymphocytes Relative 7 (*) 12 - 46 %   Lymphs Abs 1.0  0.7 - 4.0 K/uL   Monocytes Relative 7  3 - 12 %   Monocytes Absolute  1.1 (*) 0.1 - 1.0 K/uL   Eosinophils Relative 0  0 - 5 %   Eosinophils Absolute 0.0  0.0 - 0.7 K/uL   Basophils Relative 0  0 - 1 %   Basophils Absolute 0.0  0.0 - 0.1 K/uL  SAMPLE TO BLOOD BANK     Status: None   Collection Time    10/06/13  9:38 AM      Result Value Ref Range   Blood Bank Specimen SAMPLE AVAILABLE FOR TESTING     Sample Expiration 10/09/2013    HCG, QUANTITATIVE, PREGNANCY     Status: Abnormal   Collection Time    10/06/13  9:43 AM      Result Value Ref Range   hCG, Beta Chain, Quant, S 3281 (*) <5 mIU/mL  COMPREHENSIVE METABOLIC PANEL     Status: Abnormal   Collection Time    10/06/13  9:44 AM      Result Value Ref Range   Sodium 137  137 - 147 mEq/L   Potassium 3.5 (*) 3.7 - 5.3 mEq/L   Chloride 95 (*) 96 - 112 mEq/L   CO2 32  19 - 32 mEq/L   Glucose, Bld 105 (*) 70 - 99 mg/dL   BUN 13  6 - 23 mg/dL   Creatinine, Ser 9.60  0.50 - 1.10 mg/dL   Calcium 9.0  8.4 - 45.4 mg/dL   Total Protein 6.2  6.0 - 8.3 g/dL   Albumin 3.1 (*) 3.5 - 5.2 g/dL   AST 16  0 - 37 U/L   ALT 7  0 - 35 U/L   Alkaline Phosphatase 56  39 - 117 U/L   Total Bilirubin 0.5   0.3 - 1.2 mg/dL   GFR calc non Af Amer >90  >90 mL/min   GFR calc Af Amer >90  >90 mL/min   Anion gap 10  5 - 15   Mr Pelvis W Wo Contrast  10/04/2013   CLINICAL DATA:  Anterior pelvic mass. Concern for ovarian torsion. Small bowel ileus  EXAM: MRI PELVIS WITHOUT AND WITH CONTRAST  TECHNIQUE: Multiplanar multisequence MR imaging of the pelvis was performed both before and after administration of intravenous contrast.  CONTRAST:  10 cc MultiHance  COMPARISON:  CT 09/29/2013, ultrasound 10/04/2013  FINDINGS: The large ovoid mass is again demonstrated anterior to the uterus measuring 9.6 x 5.5 cm and axial dimension approximately 7.5 cm in craniocaudad dimension. This lesion has multiple round follicle type structures in the periphery which are hyperintense on T2 weighted imaging most suggestive of small peripheral follicles of a massively enlarged right ovary. The central component of the presumed a large ovary is high single on T2 weighted imaging measuring 4.4 cm and low signal on T1 weighted imaging consists with a cysts. This could represent corpus luteal cyst. There is a tubular structure leading up to the ovary extend to the cornual region of the uterus percent tibia and enlarged engorged right fallopian tube. No significant enhancement of the right ovary on the the post-contrast images.  The left ovary is identified and normal measuring 2.6 x 1.7 cm on coronal image 16, series 6 and 1.4 cm axial dimension on image 50 of series 4.  There is a moderate volume of free fluid in the pelvis with a fluid fluid level likely representing some component hemorrhage (image 18, series 4.  Again demonstrated dilated loops of small bowel.  IMPRESSION: 1. Large ovoid mass anterior to the uterus is most consistent  with an enlarged torsed right ovary. The right fallopian tube is edematous and enlarged. No evidence of post-contrast enhancement of the right ovary. 2. Large cyst within the presumed torsed right ovary. 3. Mild  amount of intraperitoneal free with a fluid fluid level suggests component of blood product. 4. Small bowel ileus. 5. Normal left ovary. Findings conveyed toPEGGY Pierce on 10/04/2013  at20:30.   Electronically Signed   By: Genevive Bi M.D.   On: 10/04/2013 20:32   US Ob Comp Less 14 Wks  10/04/2013   ADDENDUM REPORT: 10/04/2013 13:01  ADDENDUM: Correction: Please disregard the prior report. See the report below.  Intrauterine gestational sac: None  Yolk sac: None  Embryo: None  Cardiac Activity: None  Maternal uterus/adnexae: The uterus appears normal. No visible retained products of conception.  There is a complex mixed solid and cystic 9.4 x 5.4 x 8.2 cm mass anterior and superior to the uterus. There are also dilated bowel loops of the pelvis as well as free fluid in the cul-de-sac.  The right ovary is not identified. Soft tissue density in the left adnexa most likely represents a normal left ovary.  IMPRESSION: 1. No intrauterine pregnancy or retained products of conception. 2. Complex mass anterior and superior to the uterus. This could represent an abscess, hematoma, or possibly a hemorrhagic corpus luteum cyst in a torsed right ovary. There is no perfusion to this abnormality. 3. Given this mass and the presence of dilated small bowel loops and complex fluid in the cul-de-sac and the patient's elevated [place] blood count, I recommend further evaluation by abdominal and pelvic CT scan with intravenous and oral contrast.   Electronically Signed   By: Geanie Cooley M.D.   On: 10/04/2013 13:01   10/04/2013   CLINICAL DATA:  Abdominal pain with nausea and vomiting and ridging abdomen since D&E on 09/30/2013.  EXAM: OBSTETRIC <14 WK [place] AND TRANSVAGINAL OB US  TECHNIQUE: Both transabdominal and transvaginal ultrasound examinations were performed for complete evaluation of the gestation as well as the maternal uterus, adnexal regions, and pelvic cul-de-sac. Transvaginal technique was performed to assess  early pregnancy.  COMPARISON:  None.  FINDINGS: Intrauterine gestational sac: Visualized/normal in shape.  Yolk sac:  Embryo:  Cardiac Activity:  Heart Rate:   bpm  MSD:    mm    w     d  CRL:     mm    w  d                  [place] EDC:  Maternal uterus/adnexae:  Intrauterine gestational sac: None  Yolk sac:  None  Embryo:  None  Cardiac Activity: None  Maternal uterus/adnexae: The uterus appears normal. No visible retained products of conception.  There is a complex mixed solid and cystic 9.4 x 5.4 x 0.2 cm mass anterior and superior to the uterus. There are also dilated bowel loops of the pelvis as well as free fluid in the cul-de-sac.  The right ovary is not identified. Soft tissue density in the left adnexa most likely represents a normal left ovary.  Electronically Signed: By: Geanie Cooley M.D. On: 10/04/2013 12:44   US Ob Transvaginal  10/04/2013   ADDENDUM REPORT: 10/04/2013 13:01  ADDENDUM: Correction: Please disregard the prior report. See the report below.  Intrauterine gestational sac: None  Yolk sac: None  Embryo: None  Cardiac Activity: None  Maternal uterus/adnexae: The uterus appears normal. No visible retained products of conception.  There  is a complex mixed solid and cystic 9.4 x 5.4 x 8.2 cm mass anterior and superior to the uterus. There are also dilated bowel loops of the pelvis as well as free fluid in the cul-de-sac.  The right ovary is not identified. Soft tissue density in the left adnexa most likely represents a normal left ovary.  IMPRESSION: 1. No intrauterine pregnancy or retained products of conception. 2. Complex mass anterior and superior to the uterus. This could represent an abscess, hematoma, or possibly a hemorrhagic corpus luteum cyst in a torsed right ovary. There is no perfusion to this abnormality. 3. Given this mass and the presence of dilated small bowel loops and complex fluid in the cul-de-sac and the patient's elevated [place] blood count, I recommend further evaluation by  abdominal and pelvic CT scan with intravenous and oral contrast.   Electronically Signed   By: Geanie Cooley M.D.   On: 10/04/2013 13:01   10/04/2013   CLINICAL DATA:  Abdominal pain with nausea and vomiting and ridging abdomen since D&E on 09/30/2013.  EXAM: OBSTETRIC <14 WK [place] AND TRANSVAGINAL OB US  TECHNIQUE: Both transabdominal and transvaginal ultrasound examinations were performed for complete evaluation of the gestation as well as the maternal uterus, adnexal regions, and pelvic cul-de-sac. Transvaginal technique was performed to assess early pregnancy.  COMPARISON:  None.  FINDINGS: Intrauterine gestational sac: Visualized/normal in shape.  Yolk sac:  Embryo:  Cardiac Activity:  Heart Rate:   bpm  MSD:    mm    w     d  CRL:     mm    w  d                  [place] EDC:  Maternal uterus/adnexae:  Intrauterine gestational sac: None  Yolk sac:  None  Embryo:  None  Cardiac Activity: None  Maternal uterus/adnexae: The uterus appears normal. No visible retained products of conception.  There is a complex mixed solid and cystic 9.4 x 5.4 x 0.2 cm mass anterior and superior to the uterus. There are also dilated bowel loops of the pelvis as well as free fluid in the cul-de-sac.  The right ovary is not identified. Soft tissue density in the left adnexa most likely represents a normal left ovary.  Electronically Signed: By: Geanie Cooley M.D. On: 10/04/2013 12:44   Ct Abdomen Pelvis W Contrast  10/04/2013   CLINICAL DATA:  D and C 5 days ago. Elevated beta HCG level. Prior appendectomy.  EXAM: CT ABDOMEN AND PELVIS WITH CONTRAST  TECHNIQUE: Multidetector CT imaging of the abdomen and pelvis was performed using the standard protocol following bolus administration of intravenous contrast.  CONTRAST:  OMNIPAQUE IOHEXOL 300 MG/ML  SOLN  COMPARISON:  Ultrasound of earlier today.  No prior CT.  FINDINGS: Lower Chest: Clear lung bases. Normal heart size without pericardial or pleural effusion.  Abdomen/Pelvis: Suspicion  of mild hepatic steatosis. Normal spleen, stomach, pancreas, gallbladder, biliary tract, adrenal glands.  Too small to characterize lesions in both kidneys. No retroperitoneal or retrocrural adenopathy. Relatively decompressed descending colon. Relatively normal caliber of the ascending colon.  Small bowel loops are diffusely dilated and fluid-filled. Example loops measure up to 3.2 cm including on image 56. No focal transition point identified.  Small volume perihepatic ascites. No pneumatosis or other acute complication.  Within the anterior pelvis, a mixed soft tissue and fluid density "Mass" 6.0 x 10.0 cm on image 74 transverse. At least 9.8 cm on  sagittal image 72. There may be contiguous adjacent thickened small bowel loops, including on image 43. Left ovary is felt to be positioned separate from this, including on image 70. The right ovary is not confidently identified separate from this.  Small to moderate volume complex pelvic fluid/ hemorrhage.  No pelvic adenopathy. Normal urinary bladder. Normal appearance of the uterus for recent postpartum state.  Bones/Musculoskeletal:  No acute osseous abnormality.  IMPRESSION: 1. Anterior pelvic "Mass" with complex cul-de-sac fluid/hemorrhage. This appearance is indeterminate. Favored diagnostic consideration is torsion of the right ovary. Secondary considerations include hematoma (one would expect a uterine defect from recent D and C) versus abscess from distal enteritis. Pelvic MRI may be informative (to demonstrate ovarian follicles within the anterior pelvic "Mass") . 2. Moderate ileus versus partial small bowel obstruction. This is likely secondary to the anterior pelvic wall mass" . Adjacent small bowel loops appear thickened, favored to be secondarily. 3. Small volume perihepatic ascites. 4. Mild hepatic steatosis.   Electronically Signed   By: Jeronimo GreavesKyle  Talbot M.D.   On: 10/04/2013 16:04   Dg Abd 2 Views  10/06/2013   CLINICAL DATA:  Abdominal pain. Nausea  and vomiting. Recent surgery for ovarian torsion.  EXAM: ABDOMEN - 2 VIEW  COMPARISON:  Multiple priors.  FINDINGS: Distended loops of small bowel are seen in the mid abdomen with scattered air-fluid levels. There is a paucity of colonic gas. Fluid-filled loops are noted in the LEFT lower quadrant. Findings concerning for small bowel obstruction. Similar appearance to prior CT.  IMPRESSION: Findings concerning for small bowel obstruction.   Electronically Signed   By: Davonna BellingJohn  Curnes M.D.   On: 10/06/2013 10:59    Review of Systems  Constitutional: Positive for chills. Negative for fever.  Gastrointestinal: Positive for nausea, vomiting and abdominal pain.   Physical Exam   Blood pressure 121/81, pulse 92, temperature 98.6 F (37 C), temperature source Oral, resp. rate 20, SpO2 100.00%.  Physical Exam  Constitutional: She is oriented to person, place, and time. She appears well-developed. She appears toxic. She has a sickly appearance. She appears ill. No distress.  HENT:  Head: Normocephalic.  Eyes: Pupils are equal, round, and reactive to light.  Respiratory: Effort normal.  GI: She exhibits distension and ascites. There is tenderness. There is guarding. There is no rebound.  Musculoskeletal: Normal range of motion.  Neurological: She is alert and oriented to person, place, and time.  Skin: Skin is warm. She is diaphoretic.  Psychiatric: Her behavior is normal.    MAU Course  Procedures None  MDM Dr. Marice Potterove notified of patients arrival. OP noted reviewed, orders received.  CBC with diff CMP Hcg Abdominal Xray D5LR at 200 ml/hr Phenergan IV Zofran IV Dilaudid IV   Assessment and Plan   A:  Bowel obstruction  Leukocytosis  Nausea and vomiting  P: Admit to the hospital per Dr. Marice Potterove.   Iona HansenJennifer Irene Bj Morlock, NP 10/06/2013, 10:06 AM

## 2013-10-06 NOTE — ED Provider Notes (Signed)
  History     Carolyn Pierce is a 33 year old African American female,G5P1 that presents to the maternal admissions unit for vomiting 2x this morning. Bloating since Saturday (Post-Op from oophrectomy). Describes the pain as a constant and sharp throughout the day. Rates the pain a 5 on the severity scale. Endorses vaginal spotting.  Denies chest pain, headaches, palpitations, arrythmias, tingling, numbing, vaginal bleeding.   Patient denies the use of tobacco. Social drinking. Denies the use of illicit drugs. Patient practices unprotected vaginal sex with one partner.    Interview interrupted as patient was admitted.   Final Diagnosis: Ileus    CSN: [id number]  Arrival date and time: 10/06/13 [id number]   None     Chief Complaint  Patient presents with  . Post-op Problem  . Emesis   Patient is a 33 y.o. female presenting with vomiting.  Emesis       Past Medical History  Diagnosis Date  . Acne   . Essential tremor     Dr. Terrace ArabiaYan, neurology  . History of abnormal Pap smear   . History of UTI   . History of trichomonal vaginitis   . Menorrhagia     prior trials of Depo Provera, Ortho Tri Cyclen Lo  . Anemia     hx/o iron deficiency anemia  . Yeast vaginitis     recurrent    Past Surgical History  Procedure Laterality Date  . Appendectomy    . Wisdom tooth extraction    . Dilation and evacuation    . Laparoscopic unilateral salpingo oopherectomy      History reviewed. No pertinent family history.  History  Substance Use Topics  . Smoking status: Never Smoker   . Smokeless tobacco: Never Used  . Alcohol Use: No     Comment: occ    Allergies: No Known Allergies  Prescriptions prior to admission  Medication Sig Dispense Refill  . oxyCODONE-acetaminophen (PERCOCET/ROXICET) 5-325 MG per tablet Take 1-2 tablets by mouth every 4 (four) hours as needed for severe pain.  30 tablet  0  . propranolol (INDERAL) 20 MG tablet Take 1 tablet (20 mg total) by mouth daily.   60 tablet  12  . vitamin B-12 (CYANOCOBALAMIN) 1000 MCG tablet Take 1,000 mcg by mouth daily.        Review of Systems  Gastrointestinal: Positive for vomiting.   Physical Exam   Blood pressure 121/81, pulse 92, temperature 98.6 F (37 C), temperature source Oral, resp. rate 20, SpO2 100.00%.  Physical Exam  MAU Course  Procedures   Assessment and Plan  Ileus  Marena ChancyGhanem, Adaora Mchaney M 10/06/2013, 11:07 AM

## 2013-10-07 LAB — BASIC METABOLIC PANEL
Anion gap: 7 (ref 5–15)
BUN: 7 mg/dL (ref 6–23)
CO2: 31 mEq/L (ref 19–32)
Calcium: 8.8 mg/dL (ref 8.4–10.5)
Chloride: 101 mEq/L (ref 96–112)
Creatinine, Ser: 0.74 mg/dL (ref 0.50–1.10)
GFR calc Af Amer: >90 mL/min (ref >90)
GFR calc non Af Amer: >90 mL/min (ref >90)
Glucose, Bld: 134 mg/dL — ABNORMAL HIGH (ref 70–99)
Potassium: 4.4 mEq/L (ref 3.7–5.3)
Sodium: 139 mEq/L (ref 137–147)

## 2013-10-07 MED ORDER — DIPHENHYDRAMINE HCL 50 MG/ML IJ SOLN
50.0000 mg | Freq: Four times a day (QID) | INTRAMUSCULAR | Status: DC | PRN
  Administered 2013-10-07: 50 mg via INTRAVENOUS
  Filled 2013-10-07: qty 1

## 2013-10-07 MED ORDER — CHLORHEXIDINE GLUCONATE 0.12 % MT SOLN
15.0000 mL | Freq: Two times a day (BID) | OROMUCOSAL | Status: DC
  Administered 2013-10-08: 15 mL via OROMUCOSAL
  Filled 2013-10-07 (×5): qty 15

## 2013-10-07 MED ORDER — PROMETHAZINE HCL 25 MG/ML IJ SOLN
12.5000 mg | Freq: Four times a day (QID) | INTRAMUSCULAR | Status: DC | PRN
  Administered 2013-10-07 (×3): 12.5 mg via INTRAVENOUS
  Filled 2013-10-07 (×3): qty 1

## 2013-10-07 MED ORDER — MENTHOL 3 MG MT LOZG
1.0000 | LOZENGE | OROMUCOSAL | Status: DC | PRN
  Administered 2013-10-07: 3 mg via ORAL
  Filled 2013-10-07: qty 9

## 2013-10-07 MED ORDER — CETYLPYRIDINIUM CHLORIDE 0.05 % MT LIQD
7.0000 mL | Freq: Two times a day (BID) | OROMUCOSAL | Status: DC
  Administered 2013-10-07 – 2013-10-08 (×3): 7 mL via OROMUCOSAL

## 2013-10-07 NOTE — Progress Notes (Signed)
Subjective: Patient reports nausea and + flatus.  Still felling nauseous despite the NGT.  Small amount of flatus this am--first since surgery.  Objective: I have reviewed patient's vital signs, intake and output, medications, labs and radiology results.  General: alert, cooperative and appears stated age Resp: normal effort Cardio: regular rate and rhythm GI: abnormal findings:  distended and soft, moderately tender Extremities: extremities normal, atraumatic, no cyanosis or edema   Assessment/Plan: Continued ileus vs. SBO--+ flatus Increase ambulation today in hopes of improving abdominal exam    LOS: 1 day    Carolyn Pierce S 10/07/2013, 7:56 AM

## 2013-10-07 NOTE — Progress Notes (Signed)
Witnessed pt have 3 BMs since the beginning of the shift at 1930. Medium, brown loose stools.

## 2013-10-08 MED ORDER — HYDROMORPHONE HCL 2 MG PO TABS
1.0000 mg | ORAL_TABLET | ORAL | Status: DC | PRN
  Administered 2013-10-08 – 2013-10-09 (×4): 1 mg via ORAL
  Filled 2013-10-08 (×4): qty 1

## 2013-10-08 NOTE — Progress Notes (Signed)
POD # 4 s/p Exploratory Laparotomy right salpingo-oophorectomy   Subjective: Patient reports incisional pain, + flatus and + BM. Much less NGT output overnight.  Objective: I have reviewed patient's vital signs, intake and output, medications, labs and pathology.  General: alert, cooperative and appears stated age Cardio: regular rate and rhythm GI: normal findings: much less distended, soft, minimally tender, Incisioin is C/D/I Extremities: extremities normal, atraumatic, no cyanosis or edema  Assessment: s/p Ex-Lap and RSO: stable, progressing well and resolving SBO  Plan: clamp NGT--if no emesis x 4 hours--will pull.  Home tomorrow if we can advance diet and she tolerates that.  LOS: 2 days    Myeshia Fojtik S 10/08/2013, 9:52 AM

## 2013-10-09 ENCOUNTER — Encounter: Payer: Self-pay | Admitting: Obstetrics and Gynecology

## 2013-10-09 MED ORDER — IBUPROFEN 600 MG PO TABS: 600.0000 mg | ORAL_TABLET | Freq: Four times a day (QID) | ORAL | Status: DC | PRN

## 2013-10-09 MED ORDER — IBUPROFEN 600 MG PO TABS
600.0000 mg | ORAL_TABLET | Freq: Four times a day (QID) | ORAL | Status: DC | PRN
  Administered 2013-10-09: 600 mg via ORAL
  Filled 2013-10-09: qty 1

## 2013-10-09 MED ORDER — HYDROCODONE-ACETAMINOPHEN 5-325 MG PO TABS: 1.0000 | ORAL_TABLET | Freq: Four times a day (QID) | ORAL | Status: DC | PRN

## 2013-10-09 NOTE — Progress Notes (Signed)
Discharge instructions complete. Pt understood all instructions and did not have any questions. Pt ambulated out of the hospital and discharged home to family.  

## 2013-10-09 NOTE — Discharge Instructions (Signed)
Unilateral Salpingo-Oophorectomy, Care After Refer to this sheet in the next few weeks. These instructions provide you with information on caring for yourself after your procedure. Your health care provider may also give you more specific instructions. Your treatment has been planned according to current medical practices, but problems sometimes occur. Call your health care provider if you have any problems or questions after your procedure. WHAT TO EXPECT AFTER THE PROCEDURE After your procedure, it is typical to have the following:  Abdominal pain that can be controlled with pain medicine.  Vaginal spotting.  Constipation. HOME CARE INSTRUCTIONS   Get plenty of rest and sleep.  Only take over-the-counter or prescription medicines as directed by your health care provider. Do not take aspirin. It can cause bleeding.  Keep incision areas clean and dry. Remove or change any bandages (dressings) only as directed by your health care provider.  Follow your health care provider's advice regarding diet.  Drink enough fluids to keep your urine clear or pale yellow.  Limit exercise and activities as directed by your health care provider. Do not lift anything heavier than 5 pounds (2.3 kg) until your health care provider approves.  Do not drive until your health care provider approves.  Do not drink alcohol until your health care provider approves.  Do not have sexual intercourse until your health care provider says it is OK.  Take your temperature twice a day and write it down.  If you become constipated, you may:  Ask your health care provider about taking a mild laxative.  Add more fruit and bran to your diet.  Drink more fluids.  Follow up with your health care provider as directed. SEEK MEDICAL CARE IF:   You have swelling or redness in the incision area.  You develop a rash.  You feel lightheaded.  You have pain that is not controlled with medicine.  You have pain,  swelling, or redness where the IV access tube was placed. SEEK IMMEDIATE MEDICAL CARE IF:  You have a fever.  You develop increasing abdominal pain.  You see pus coming out of the incision, or the incision is separating.  You notice a bad smell coming from the wound or dressing.  You have excessive vaginal bleeding.  You feel sick to your stomach (nauseous) and vomit.  You have leg or chest pain.  You have pain when you urinate.  You develop shortness of breath.  You pass out. Document Released: 12/03/2008 Document Revised: 11/27/2012 Document Reviewed: 07/31/2012 Exit[employer] Patient Information 2015 [employer]. This information is not intended to replace advice given to you by your health care provider. Make sure you discuss any questions you have with your health care provider.  

## 2013-10-09 NOTE — Discharge Summary (Signed)
Physician Discharge Summary  Patient ID: Carolyn Pierce MRN: [id number] DOB/AGE: 1980/12/12 33 y.o.  Admit date: 10/06/2013 Discharge date: 10/09/2013  Admission Diagnoses:, bowel obstruction/ileus.  Discharge Diagnoses:  Active Problems:   * No active hospital problems. *   Discharged Condition: good  Hospital Course: Carolyn Pierce is a 33 y.o. female [phone] who presents to [facility] with worsening abdominal pain and emesis following a Diagnostic laparoscopy, Exploratory laparotomy, Right Salpingoohorectomy on 8/16 for a right ovarian torsion. The patient presented to [facility] on 8/16 with severe abdominal pain following a TAB she had done the previous week. She was found to have a small ileus, acute renal failure. MRI confirmed the torsion. The patient was seen by general surgery and operated on by Dr. Catalina AntiguaPeggy Constant. The patient was sent home the same day. She states she began vomiting bile yesterday; and states her abdomen is large and hard. She was admitted 8/17 She was treated with NG suction and responded well., had several BM with in 2 days. NG suction was pulled on 8/19, and pt felt stable for discharge by 8/20    Consults: general surgery  Significant Diagnostic Studies: labs:  CBC Latest Ref Rng 10/06/2013 10/05/2013 10/04/2013  WBC 4.0 - 10.5 K/uL 15.1(H) 17.9(H) 30.2(H)  Hemoglobin 12.0 - 15.0 g/dL 1.4(N8.5(L) 7.8(L) 10.5(L)  Hematocrit 36.0 - 46.0 % 26.4(L) 23.8(L) 31.2(L)  Platelets 150 - 400 K/uL 402(H) 351 415(H)      Treatments: ng suction, IV hydration  Discharge Exam: Blood pressure 106/65, pulse 100, temperature 98.6 F (37 C), temperature source Oral, resp. rate 18, height 5\' 2"  (1.575 m), weight 125 lb (56.7 kg), SpO2 100.00%. General appearance: alert, cooperative and distracted Eyes: conjunctivae/corneas clear. PERRL, EOM's intact. Fundi benign. Throat: lips, mucosa, and tongue normal; teeth and gums normal Chest wall: no tenderness GI: soft, non-tender; bowel  sounds normal; no masses,  no organomegaly and incision at umbilicus and lower abdomen both intact  Pelvic: cervix normal in appearance, vagina normal without discharge and . Incision/Wound:  Disposition: 01-Home or Self Care  Discharge Instructions   Diet - low sodium heart healthy    Complete by:  As directed      Discharge instructions    Complete by:  As directed   You will be out of work x 4 weeks, please gradually increase activity, you may drive after 2 wks.     Increase activity slowly    Complete by:  As directed             Medication List    STOP taking these medications       oxyCODONE-acetaminophen 5-325 MG per tablet  Commonly known as:  PERCOCET/ROXICET      TAKE these medications       HYDROcodone-acetaminophen 5-325 MG per tablet  Commonly known as:  NORCO/VICODIN  Take 1-2 tablets by mouth every 6 (six) hours as needed.     ibuprofen 600 MG tablet  Commonly known as:  ADVIL,MOTRIN  Take 1 tablet (600 mg total) by mouth every 6 (six) hours as needed for moderate pain.     propranolol 20 MG tablet  Commonly known as:  INDERAL  Take 1 tablet (20 mg total) by mouth daily.     vitamin B-12 1000 MCG tablet  Commonly known as:  CYANOCOBALAMIN  Take 1,000 mcg by mouth daily.         SignedTilda Burrow: Imaan Padgett V 10/09/2013, 9:37 AM

## 2013-10-09 NOTE — Progress Notes (Signed)
Ur chart review completed.  

## 2013-10-10 ENCOUNTER — Telehealth: Payer: Self-pay | Admitting: *Deleted

## 2013-10-10 NOTE — Telephone Encounter (Signed)
Pt called nurse line and requested information about process for FMLA paperwork for postop status.  Contacted patient, patient wanted to confirm arrival of FMLA paperwork.  Paperwork is not currently in office.  Correct fax number given to patient.  Pt will call on Monday 8/24 to confirm arrival of FMLA paperwork from employer.

## 2013-10-13 ENCOUNTER — Telehealth: Payer: Self-pay | Admitting: *Deleted

## 2013-10-13 NOTE — Telephone Encounter (Signed)
Pt left message stating that she spoke with Candace on last Friday. She has called and requested both companies - [employer] (for [employer]) and [employer] (for short term disability) to re-fax PPW. She wants to be sure we have received the faxes.

## 2013-10-13 NOTE — Telephone Encounter (Signed)
Called patient stating I am returning your phone call and wanted to let you know we received the paperwork from both [employer] and [employer]. Patient verbalized understanding and asked how she would know the papers were ready. Told patient we will contact her to let her know when we have completed the papers. Patient verbalized understanding and had no other questions

## 2013-10-14 ENCOUNTER — Encounter: Payer: Self-pay | Admitting: *Deleted

## 2013-10-14 NOTE — Progress Notes (Signed)
FMLA paperwork completed, contacted patient to come to clinic and sign a ROI>  Pt verbalizes she will come in the am to sign ROI.

## 2013-10-15 ENCOUNTER — Encounter: Payer: Self-pay | Admitting: *Deleted

## 2013-10-20 ENCOUNTER — Encounter: Payer: Self-pay | Admitting: *Deleted

## 2013-10-20 NOTE — Progress Notes (Signed)
Changes to [facility] paperwork completed and re faxed.

## 2013-10-29 ENCOUNTER — Encounter: Payer: Self-pay | Admitting: *Deleted

## 2013-10-30 ENCOUNTER — Ambulatory Visit (INDEPENDENT_AMBULATORY_CARE_PROVIDER_SITE_OTHER): Payer: Medicaid Other | Admitting: Obstetrics and Gynecology

## 2013-10-30 VITALS — BP 114/71 | HR 82 | Ht 62.0 in | Wt 119.5 lb

## 2013-10-30 DIAGNOSIS — Z9889 Other specified postprocedural states: Secondary | ICD-10-CM

## 2013-10-30 NOTE — Progress Notes (Signed)
Patient ID: Carolyn Pierce, female   DOB: 1980/05/08, 33 y.o.   MRN: [id number] 33 yo here for postop check s/p RSO on 8/16 for treatment of ovarian torsion. Patient had post-op course complicated by re-admission for ileus. Patient was discharged after 2 days following abdominal rest with NG tube. Patient presents today feeling extremely well. She has no complaints. She is not taking any pain medications  Past Medical History  Diagnosis Date  . Acne   . Essential tremor     Dr. Terrace Arabia, neurology  . History of abnormal Pap smear   . History of UTI   . History of trichomonal vaginitis   . Menorrhagia     prior trials of Depo Provera, Ortho Tri Cyclen Lo  . Anemia     hx/o iron deficiency anemia  . Yeast vaginitis     recurrent   Past Surgical History  Procedure Laterality Date  . Appendectomy    . Wisdom tooth extraction    . Dilation and evacuation    . Laparoscopic unilateral salpingo oopherectomy    . Laparoscopy N/A 10/04/2013    Procedure: LAPAROSCOPY OPERATIVE, Removal of Right Fallopian Tube and Ovary;  Surgeon: Catalina Antigua, MD;  Location: [facility];  Service: Gynecology;  Laterality: N/A;   No family history on file. History  Substance Use Topics  . Smoking status: Never Smoker   . Smokeless tobacco: Never Used  . Alcohol Use: No     Comment: occ   GENERAL: Well-developed, well-nourished female in no acute distress.  ABDOMEN: Soft, nontender, nondistended. No organomegaly. Incision: Umbilical, LLQ and Kerr incision healed completely without evidence of erythema, induration or drainage PELVIC: Normal external female genitalia. Vagina is pink and rugated.  Normal discharge. Normal appearing cervix. Uterus is normal in size. No adnexal mass or tenderness. EXTREMITIES: No cyanosis, clubbing, or edema, 2+ distal pulses.  A/P 33 yo s/p Ex-lap RSO on 10/05/2013 for treatment of right ovarian torsion - pathology results reviewed with the patient - patient is medically cleared to  resume all activities of daily living - follow up with pcp for annual care

## 2013-12-22 ENCOUNTER — Encounter (HOSPITAL_COMMUNITY): Payer: Self-pay

## 2013-12-26 ENCOUNTER — Ambulatory Visit: Payer: Self-pay | Admitting: Adult Health

## 2013-12-30 ENCOUNTER — Encounter: Payer: Self-pay | Admitting: *Deleted

## 2014-10-20 ENCOUNTER — Emergency Department (INDEPENDENT_AMBULATORY_CARE_PROVIDER_SITE_OTHER): Payer: Self-pay

## 2014-10-20 ENCOUNTER — Emergency Department (INDEPENDENT_AMBULATORY_CARE_PROVIDER_SITE_OTHER)
Admission: EM | Admit: 2014-10-20 | Discharge: 2014-10-20 | Disposition: A | Discharge status: Home / Self Care | Payer: Self-pay | Source: Home / Self Care | Attending: Family Medicine | Admitting: Family Medicine

## 2014-10-20 ENCOUNTER — Encounter (HOSPITAL_COMMUNITY): Payer: Self-pay | Admitting: Emergency Medicine

## 2014-10-20 DIAGNOSIS — R202 Paresthesia of skin: Secondary | ICD-10-CM

## 2014-10-20 MED ORDER — NAPROXEN 500 MG PO TABS: 500.0000 mg | ORAL_TABLET | Freq: Two times a day (BID) | ORAL | Status: DC

## 2014-10-20 MED ORDER — CYCLOBENZAPRINE HCL 10 MG PO TABS: 10.0000 mg | ORAL_TABLET | Freq: Every evening | ORAL | Status: DC | PRN

## 2014-10-20 NOTE — Discharge Instructions (Signed)
Thank you for coming in today. Come back or go to the emergency room if you notice new weakness new numbness problems walking or bowel or bladder problems. Follow up with me if not better.   Cervical Radiculopathy Cervical radiculopathy happens when a nerve in the neck is pinched or bruised by a slipped (herniated) disk or by arthritic changes in the bones of the cervical spine. This can occur due to an injury or as part of the normal aging process. Pressure on the cervical nerves can cause pain or numbness that runs from your neck all the way down into your arm and fingers. CAUSES  There are many possible causes, including:  Injury.  Muscle tightness in the neck from overuse.  Swollen, painful joints (arthritis).  Breakdown or degeneration in the bones and joints of the spine (spondylosis) due to aging.  Bone spurs that may develop near the cervical nerves. SYMPTOMS  Symptoms include pain, weakness, or numbness in the affected arm and hand. Pain can be severe or irritating. Symptoms may be worse when extending or turning the neck. DIAGNOSIS  Your caregiver will ask about your symptoms and do a physical exam. He or she may test your strength and reflexes. X-rays, CT scans, and MRI scans may be needed in cases of injury or if the symptoms do not go away after a period of time. Electromyography (EMG) or nerve conduction testing may be done to study how your nerves and muscles are working. TREATMENT  Your caregiver may recommend certain exercises to help relieve your symptoms. Cervical radiculopathy can, and often does, get better with time and treatment. If your problems continue, treatment options may include:  Wearing a soft collar for short periods of time.  Physical therapy to strengthen the neck muscles.  Medicines, such as nonsteroidal anti-inflammatory drugs (NSAIDs), oral corticosteroids, or spinal injections.  Surgery. Different types of surgery may be done depending on the  cause of your problems. HOME CARE INSTRUCTIONS   Put ice on the affected area.  Put ice in a plastic bag.  Place a towel between your skin and the bag.  Leave the ice on for 15-20 minutes, 03-04 times a day or as directed by your caregiver.  If ice does not help, you can try using heat. Take a warm shower or bath, or use a hot water bottle as directed by your caregiver.  You may try a gentle neck and shoulder massage.  Use a flat pillow when you sleep.  Only take over-the-counter or prescription medicines for pain, discomfort, or fever as directed by your caregiver.  If physical therapy was prescribed, follow your caregiver's directions.  If a soft collar was prescribed, use it as directed. SEEK IMMEDIATE MEDICAL CARE IF:   Your pain gets much worse and cannot be controlled with medicines.  You have weakness or numbness in your hand, arm, face, or leg.  You have a high fever or a stiff, rigid neck.  You lose bowel or bladder control (incontinence).  You have trouble with walking, balance, or speaking. MAKE SURE YOU:   Understand these instructions.  Will watch your condition.  Will get help right away if you are not doing well or get worse. Document Released: 11/01/2000 Document Revised: 05/01/2011 Document Reviewed: 09/20/2010 [facility] Patient Information 2015 [place], [place]. This information is not intended to replace advice given to you by your health care provider. Make sure you discuss any questions you have with your health care provider.
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
## 2014-10-23 ENCOUNTER — Ambulatory Visit: Payer: Self-pay | Admitting: Family Medicine

## 2014-10-29 ENCOUNTER — Ambulatory Visit (INDEPENDENT_AMBULATORY_CARE_PROVIDER_SITE_OTHER): Payer: Self-pay | Admitting: Family Medicine

## 2014-10-29 ENCOUNTER — Encounter: Payer: Self-pay | Admitting: Family Medicine

## 2014-10-29 VITALS — BP 117/81 | HR 117 | Wt 124.0 lb

## 2014-10-29 DIAGNOSIS — M5412 Radiculopathy, cervical region: Secondary | ICD-10-CM | POA: Insufficient documentation

## 2014-10-29 NOTE — Assessment & Plan Note (Signed)
Cervical radiculopathy presenting as paresthesias as well as cervical spasm into the trapezius and rhomboids. Patient symptomatically now for about a week. This is despite rest Flexeril and naproxen. We'll continue the above medications. Additionally refer to physical therapy. Patient declined amitriptyline or gabapentin or prednisone. If not better after a few weeks of physical therapy will proceed with MRI. Additionally discussed red flag signs or symptoms related to cervical radiculopathy.
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
## 2014-10-29 NOTE — Patient Instructions (Signed)
Thank you for coming in today. Continue naproxen or ibuprofen and flexeril.  I think prednsone and gabapentin can help.  Go to physical therapy. Main: [phone]  Come back or go to the emergency room if you notice new weakness new numbness problems walking or bowel or bladder problems.  Cervical Radiculopathy Cervical radiculopathy happens when a nerve in the neck is pinched or bruised by a slipped (herniated) disk or by arthritic changes in the bones of the cervical spine. This can occur due to an injury or as part of the normal aging process. Pressure on the cervical nerves can cause pain or numbness that runs from your neck all the way down into your arm and fingers. CAUSES  There are many possible causes, including:  Injury.  Muscle tightness in the neck from overuse.  Swollen, painful joints (arthritis).  Breakdown or degeneration in the bones and joints of the spine (spondylosis) due to aging.  Bone spurs that may develop near the cervical nerves. SYMPTOMS  Symptoms include pain, weakness, or numbness in the affected arm and hand. Pain can be severe or irritating. Symptoms may be worse when extending or turning the neck. DIAGNOSIS  Your caregiver will ask about your symptoms and do a physical exam. He or she may test your strength and reflexes. X-rays, CT scans, and MRI scans may be needed in cases of injury or if the symptoms do not go away after a period of time. Electromyography (EMG) or nerve conduction testing may be done to study how your nerves and muscles are working. TREATMENT  Your caregiver may recommend certain exercises to help relieve your symptoms. Cervical radiculopathy can, and often does, get better with time and treatment. If your problems continue, treatment options may include:  Wearing a soft collar for short periods of time.  Physical therapy to strengthen the neck muscles.  Medicines, such as nonsteroidal anti-inflammatory drugs (NSAIDs), oral  corticosteroids, or spinal injections.  Surgery. Different types of surgery may be done depending on the cause of your problems. HOME CARE INSTRUCTIONS   Put ice on the affected area.  Put ice in a plastic bag.  Place a towel between your skin and the bag.  Leave the ice on for 15-20 minutes, 03-04 times a day or as directed by your caregiver.  If ice does not help, you can try using heat. Take a warm shower or bath, or use a hot water bottle as directed by your caregiver.  You may try a gentle neck and shoulder massage.  Use a flat pillow when you sleep.  Only take over-the-counter or prescription medicines for pain, discomfort, or fever as directed by your caregiver.  If physical therapy was prescribed, follow your caregiver's directions.  If a soft collar was prescribed, use it as directed. SEEK IMMEDIATE MEDICAL CARE IF:   Your pain gets much worse and cannot be controlled with medicines.  You have weakness or numbness in your hand, arm, face, or leg.  You have a high fever or a stiff, rigid neck.  You lose bowel or bladder control (incontinence).  You have trouble with walking, balance, or speaking. MAKE SURE YOU:   Understand these instructions.  Will watch your condition.  Will get help right away if you are not doing well or get worse. Document Released: 11/01/2000 Document Revised: 05/01/2011 Document Reviewed: 09/20/2010 [facility] Patient Information 2015 [place], [place]. This information is not intended to replace advice given to you by your health care provider. Make sure you discuss  any questions you have with your health care provider.  

## 2014-11-10 ENCOUNTER — Ambulatory Visit: Payer: Self-pay | Attending: Family Medicine | Admitting: Physical Therapy

## 2014-11-10 ENCOUNTER — Encounter: Payer: Self-pay | Admitting: Physical Therapy

## 2014-11-10 DIAGNOSIS — M79602 Pain in left arm: Secondary | ICD-10-CM | POA: Insufficient documentation

## 2014-11-10 DIAGNOSIS — M25512 Pain in left shoulder: Secondary | ICD-10-CM | POA: Insufficient documentation

## 2014-11-10 DIAGNOSIS — M542 Cervicalgia: Secondary | ICD-10-CM | POA: Insufficient documentation
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
## 2014-11-16 ENCOUNTER — Encounter: Payer: Self-pay | Admitting: Physical Therapy

## 2014-11-16 ENCOUNTER — Ambulatory Visit: Payer: Self-pay | Admitting: Physical Therapy

## 2014-11-16 DIAGNOSIS — M542 Cervicalgia: Secondary | ICD-10-CM

## 2014-11-16 DIAGNOSIS — M25512 Pain in left shoulder: Secondary | ICD-10-CM

## 2014-11-16 DIAGNOSIS — M79602 Pain in left arm: Secondary | ICD-10-CM

## 2014-11-16 NOTE — Therapy (Signed)
[facility] [place] [facility] [address] [place], [place], [address] Phone: [phone]   Fax:  [phone]  Physical Therapy Treatment  Patient Details  Name: Carolyn Pierce MRN: [id number] Date of Birth: 03/15/1980 Referring Provider:  Jac Canavan, PA-C  Encounter Date: 11/16/2014      PT End of Session - 11/16/14 1340    Visit Number 2   Date for PT Re-Evaluation 01/10/15   PT Start Time 1301   PT Stop Time 1354   PT Time Calculation (min) 53 min   Activity Tolerance Patient tolerated treatment well   Behavior During Therapy [facility] for tasks assessed/performed      Past Medical History  Diagnosis Date  . Acne   . Essential tremor     Dr. Terrace Arabia, neurology  . History of abnormal Pap smear   . History of UTI   . History of trichomonal vaginitis   . Menorrhagia     prior trials of Depo Provera, Ortho Tri Cyclen Lo  . Anemia     hx/o iron deficiency anemia  . Yeast vaginitis     recurrent    Past Surgical History  Procedure Laterality Date  . Appendectomy    . Wisdom tooth extraction    . Dilation and evacuation    . Laparoscopic unilateral salpingo oopherectomy    . Laparoscopy N/A 10/04/2013    Procedure: LAPAROSCOPY OPERATIVE, Removal of Right Fallopian Tube and Ovary;  Surgeon: Catalina Antigua, MD;  Location: [facility];  Service: Gynecology;  Laterality: N/A;    There were no vitals filed for this visit.  Visit Diagnosis:  Neck pain  Left shoulder pain  Left arm pain      Subjective Assessment - 11/16/14 1301    Subjective Pt reports that she feels like her arm is getting better, but still has some pain in shoulder and neck. Overall improvement    Currently in Pain? Yes   Pain Score 4    Pain Location Shoulder                         [facility] Adult PT Treatment/Exercise - 11/16/14 0001    Exercises   Exercises Shoulder   Shoulder Exercises: Seated   Other Seated Exercises UBE L1  27fwd/2bak    Shoulder Exercises: Standing   Horizontal ABduction 10 reps  2 sets    Theraband Level (Shoulder Horizontal ABduction) Level 3 (Green)   Shoulder Exercises: Power [employer] 10 reps  3 sets   Row Limitations #20    Other Power [employer] Exercises Lat pull downs #25 3X10    Other Power [employer] Exercises Standing straight arm pull downs #25 2X15    Modalities   Modalities Electrical Stimulation;Moist Heat   Moist Heat Therapy   Number Minutes Moist Heat 15 Minutes   Moist Heat Location Cervical   Electrical Stimulation   Electrical Stimulation Location left upper trap and neck area   Electrical Stimulation Action premod    Electrical Stimulation Parameters to tolerance    Electrical Stimulation Goals Pain   Manual Therapy   Manual Therapy Soft tissue mobilization;Passive ROM   Manual therapy comments Cervical paraspinals,upper traps    Soft tissue mobilization posterior cervical para spinals    Passive ROM cervical rotation & side bend                   PT Short Term Goals - 11/10/14  1002    PT SHORT TERM GOAL #1   Title independent with initial HEP   Time 2   Period Weeks   Status New           PT Long Term Goals - 11/10/14 1002    PT LONG TERM GOAL #1   Title understand proper posture and ergonomics   Time 8   Period Weeks   Status New   PT LONG TERM GOAL #2   Title decrease pain 50%   Time 8   Period Weeks   Status New   PT LONG TERM GOAL #3   Title increase cervical ROM to WNL's   Time 8   Period Weeks   Status New   PT LONG TERM GOAL #4   Title report no difficulty using computer at work   Time 8   Period Weeks   Status New               Plan - 11/16/14 1343    Clinical Impression Statement Pt able to progress to gym level exercises this date. Pt does report soreness with horizontal abduction. Tight upper traps tender with trigger point. Does report tenseness with MT, pain L rhomboid area    Pt will benefit from skilled  therapeutic intervention in order to improve on the following deficits Decreased strength;Decreased range of motion;Increased muscle spasms;Impaired UE functional use;Pain   Rehab Potential Good   PT Frequency 2x / week   PT Duration 8 weeks   PT Treatment/Interventions Electrical Stimulation;Moist Heat;Therapeutic exercise;Manual techniques;Ultrasound;Patient/family education   PT Next Visit Plan Slowly add gym exercises, go over posture and ergonomics        Problem List Patient Active Problem List   Diagnosis Date Noted  . Cervical radiculopathy 10/29/2014  . Tremor 12/26/2012  . Anxiety 12/26/2012    Grayce Sessions, PTA  11/16/2014, 1:46 PM  [facility] [place] [address] [place], [place], [address] Phone: [phone]   Fax:  [phone]

## 2014-11-19 ENCOUNTER — Ambulatory Visit: Payer: Self-pay | Admitting: Physical Therapy

## 2014-12-07 ENCOUNTER — Ambulatory Visit: Payer: Self-pay | Admitting: Physical Therapy

## 2014-12-14 ENCOUNTER — Ambulatory Visit: Payer: Self-pay | Attending: Family Medicine | Admitting: Physical Therapy

## 2014-12-14 ENCOUNTER — Encounter: Payer: Self-pay | Admitting: Physical Therapy

## 2014-12-14 DIAGNOSIS — M79602 Pain in left arm: Secondary | ICD-10-CM | POA: Insufficient documentation

## 2014-12-14 DIAGNOSIS — M542 Cervicalgia: Secondary | ICD-10-CM | POA: Insufficient documentation

## 2014-12-14 DIAGNOSIS — M25512 Pain in left shoulder: Secondary | ICD-10-CM | POA: Insufficient documentation

## 2014-12-14 NOTE — Therapy (Signed)
Carolyn Pierce, [place], [address] Phone: [phone]   Fax:  [phone]  Physical Therapy Treatment  Patient Details  Name: Carolyn Pierce MRN: [id number] Date of Birth: 08-10-1980 No Data Recorded  Encounter Date: 12/14/2014      PT End of Session - 12/14/14 1423    Visit Number 3   Date for PT Re-Evaluation 01/10/15   PT Start Time 1344   PT Stop Time 1423   PT Time Calculation (min) 39 min   Activity Tolerance Patient tolerated treatment well   Behavior During Therapy [facility] for tasks assessed/performed      Past Medical History  Diagnosis Date  . Acne   . Essential tremor     Dr. Krista Blue, neurology  . History of abnormal Pap smear   . History of UTI   . History of trichomonal vaginitis   . Menorrhagia     prior trials of Depo Provera, Ortho Tri Cyclen Lo  . Anemia     hx/o iron deficiency anemia  . Yeast vaginitis     recurrent    Past Surgical History  Procedure Laterality Date  . Appendectomy    . Wisdom tooth extraction    . Dilation and evacuation    . Laparoscopic unilateral salpingo oopherectomy    . Laparoscopy N/A 10/04/2013    Procedure: LAPAROSCOPY OPERATIVE, Removal of Right Fallopian Tube and Ovary;  Surgeon: Mora Bellman, MD;  Location: [facility];  Service: Gynecology;  Laterality: N/A;    There were no vitals filed for this visit.  Visit Diagnosis:  Left shoulder pain  Neck pain  Left arm pain      Subjective Assessment - 12/14/14 1353    Subjective Pt reports overall improvement and decrease pain    Currently in Pain? Yes   Pain Score 3    Pain Location Arm   Pain Orientation Right            [facility] PT Assessment - 12/14/14 0001    AROM   Overall AROM Comments Shoulder AROM WFL's with tightness and tenderness, Cervical ROM decreased WFL   Strength   Overall Strength Comments 5/5 with some ache in the left posterior shoulder                      [facility] Adult PT Treatment/Exercise - 12/14/14 0001    Shoulder Exercises: Seated   Other Seated Exercises UBE L2 28fd/3bak    Shoulder Exercises: Standing   Horizontal ABduction 10 reps  2 sets    Theraband Level (Shoulder Horizontal ABduction) Level 2 (Red)   External Rotation 10 reps;Theraband  2 sets    Theraband Level (Shoulder External Rotation) Level 2 (Red)   Flexion 15 reps;Weights  2 sets   Shoulder Flexion Weight (lbs) 3   Row 15 reps;Weights  2 sets    Row Weight (lbs) 25lb    Other Standing Exercises Standing straight arm  pull downs #25 2x15    Shoulder Exercises: Power [employer] reps  3 sets    Row Limitations #20    Other Power [employer] Exercises Lat pull downs #25 2X10    Manual Therapy   Manual Therapy Soft tissue mobilization   Soft tissue mobilization upper traps rhomboids                   PT Short Term Goals - 11/10/14 1002  PT SHORT TERM GOAL #1   Title independent with initial HEP   Time 2   Period Weeks   Status New           PT Long Term Goals - 12/14/14 1425    PT LONG TERM GOAL #2   Title decrease pain 50%   Status Partially Met   PT LONG TERM GOAL #3   Title increase cervical ROM to WNL's   Status Achieved               Plan - 12/14/14 1423    Clinical Impression Statement Pt reports that she has no issues with her cervical spine, some pain down L UE and scapular area with palpation. Completed all exercises this date, some pain with horizontal abduction.    Pt will benefit from skilled therapeutic intervention in order to improve on the following deficits Decreased strength;Decreased range of motion;Increased muscle spasms;Impaired UE functional use;Pain   Rehab Potential Good   PT Frequency 2x / week   PT Duration 8 weeks   PT Treatment/Interventions Electrical Stimulation;Moist Heat;Therapeutic exercise;Manual techniques;Ultrasound;Patient/family education   PT Next Visit Plan  possible d/c         Problem List Patient Active Problem List   Diagnosis Date Noted  . Cervical radiculopathy 10/29/2014  . Tremor 12/26/2012  . Anxiety 12/26/2012    Scot Jun, PTA  12/14/2014, 2:26 PM  [place], [place], [address] Phone: [phone]   Fax:  [phone]  Name: Carolyn Pierce MRN: [id number] Date of Birth: 12-21-1980

## 2014-12-17 ENCOUNTER — Ambulatory Visit: Payer: Self-pay | Admitting: Physical Therapy

## 2014-12-17 ENCOUNTER — Encounter: Payer: Self-pay | Admitting: Physical Therapy

## 2014-12-17 DIAGNOSIS — M542 Cervicalgia: Secondary | ICD-10-CM

## 2014-12-17 DIAGNOSIS — M79602 Pain in left arm: Secondary | ICD-10-CM

## 2014-12-17 DIAGNOSIS — M25512 Pain in left shoulder: Secondary | ICD-10-CM

## 2014-12-17 NOTE — Therapy (Signed)
[place], [place], [address] Phone: [phone]   Fax:  [phone]  Physical Therapy Treatment  Patient Details  Name: Carolyn Pierce MRN: [id number] Date of Birth: [birth year] No Data Recorded  Encounter Date: 12/17/2014      PT End of Session - 12/17/14 1411    Visit Number 4   Date for PT Re-Evaluation 01/10/15   PT Start Time 1345   PT Stop Time 1411   PT Time Calculation (min) 26 min      Past Medical History  Diagnosis Date  . Acne   . Essential tremor     Dr. Krista Blue, neurology  . History of abnormal Pap smear   . History of UTI   . History of trichomonal vaginitis   . Menorrhagia     prior trials of Depo Provera, Ortho Tri Cyclen Lo  . Anemia     hx/o iron deficiency anemia  . Yeast vaginitis     recurrent    Past Surgical History  Procedure Laterality Date  . Appendectomy    . Wisdom tooth extraction    . Dilation and evacuation    . Laparoscopic unilateral salpingo oopherectomy    . Laparoscopy N/A 10/04/2013    Procedure: LAPAROSCOPY OPERATIVE, Removal of Right Fallopian Tube and Ovary;  Surgeon: Mora Bellman, MD;  Location: [facility];  Service: Gynecology;  Laterality: N/A;    There were no vitals filed for this visit.  Visit Diagnosis:  Left shoulder pain  Neck pain  Left arm pain      Subjective Assessment - 12/17/14 1347    Subjective Pt reports things are going fine    Currently in Pain? No/denies   Pain Score 0-No pain                         [facility] Adult PT Treatment/Exercise - 12/17/14 0001    Shoulder Exercises: Seated   Other Seated Exercises UBE L2 47fd/3bak    Shoulder Exercises: Standing   Horizontal ABduction 10 reps  2 sets   Theraband Level (Shoulder Horizontal ABduction) Level 2 (Red)   External Rotation --  2 sets   Row 15 reps;Weights  2 sets   Row Weight (lbs) 35lb    Other Standing Exercises Standing straight arm   pull downs #25 2x10    Shoulder Exercises: Power [employer]15 reps  2 sets    Row Limitations #20    Other Power Tower Exercises Lat pull downs #25 2X15                  PT Short Term Goals - 11/10/14 1002    PT SHORT TERM GOAL #1   Title independent with initial HEP   Time 2   Period Weeks   Status New           PT Long Term Goals - 12/17/14 1349    PT LONG TERM GOAL #1   Title understand proper posture and ergonomics   Status Achieved   PT LONG TERM GOAL #2   Title decrease pain 50%   Status Achieved   PT LONG TERM GOAL #4   Title report no difficulty using computer at work   Status Achieved               Plan - 12/17/14 1411    Clinical Impression Statement Pt performed all interventions well  and reports that she has no functional limitations at home or at work. Pt has met all long term goals. Pt has med majority of goals, Pt UE ROM and strength are WFL.   Pt will benefit from skilled therapeutic intervention in order to improve on the following deficits Decreased strength;Decreased range of motion;Increased muscle spasms;Impaired UE functional use;Pain   Rehab Potential Good   PT Frequency 2x / week   PT Duration 8 weeks   PT Treatment/Interventions Electrical Stimulation;Moist Heat;Therapeutic exercise;Manual techniques;Ultrasound;Patient/family education   PT Next Visit Plan DC        Problem List Patient Active Problem List   Diagnosis Date Noted  . Cervical radiculopathy 10/29/2014  . Tremor 12/26/2012  . Anxiety 12/26/2012   PHYSICAL THERAPY DISCHARGE SUMMARY  Visits from Start of Care: 4  Plan: Patient agrees to discharge.  Patient goals were partially met. Patient is being discharged due to meeting the stated rehab goals.  ?????         Scot Jun, PTA  12/17/2014, 2:15 PM  [place], [place], [address] Phone: [phone]    Fax:  [phone]  Name: Carolyn Pierce MRN: [id number] Date of Birth: 1980/09/23

## 2014-12-22 ENCOUNTER — Ambulatory Visit: Payer: Self-pay | Admitting: Physical Therapy

## 2015-05-04 DIAGNOSIS — M79672 Pain in left foot: Secondary | ICD-10-CM | POA: Diagnosis not present

## 2015-05-04 DIAGNOSIS — D500 Iron deficiency anemia secondary to blood loss (chronic): Secondary | ICD-10-CM | POA: Diagnosis not present

## 2015-05-04 DIAGNOSIS — E538 Deficiency of other specified B group vitamins: Secondary | ICD-10-CM | POA: Diagnosis not present

## 2015-05-04 DIAGNOSIS — R937 Abnormal findings on diagnostic imaging of other parts of musculoskeletal system: Secondary | ICD-10-CM | POA: Diagnosis not present

## 2015-05-04 DIAGNOSIS — S99912A Unspecified injury of left ankle, initial encounter: Secondary | ICD-10-CM | POA: Diagnosis not present

## 2015-05-04 DIAGNOSIS — M25572 Pain in left ankle and joints of left foot: Secondary | ICD-10-CM | POA: Diagnosis not present

## 2015-05-11 DIAGNOSIS — R251 Tremor, unspecified: Secondary | ICD-10-CM | POA: Diagnosis not present

## 2015-05-11 DIAGNOSIS — O200 Threatened abortion: Secondary | ICD-10-CM | POA: Diagnosis not present

## 2015-05-11 DIAGNOSIS — D500 Iron deficiency anemia secondary to blood loss (chronic): Secondary | ICD-10-CM | POA: Diagnosis not present

## 2015-05-11 DIAGNOSIS — Z3200 Encounter for pregnancy test, result unknown: Secondary | ICD-10-CM | POA: Diagnosis not present

## 2015-05-11 DIAGNOSIS — Z113 Encounter for screening for infections with a predominantly sexual mode of transmission: Secondary | ICD-10-CM | POA: Diagnosis not present

## 2015-05-14 DIAGNOSIS — Z3A28 28 weeks gestation of pregnancy: Secondary | ICD-10-CM | POA: Diagnosis not present

## 2015-05-14 DIAGNOSIS — O360139 Maternal care for anti-D [Rh] antibodies, third trimester, other fetus: Secondary | ICD-10-CM | POA: Diagnosis not present

## 2015-05-19 DIAGNOSIS — R937 Abnormal findings on diagnostic imaging of other parts of musculoskeletal system: Secondary | ICD-10-CM | POA: Diagnosis not present

## 2015-05-19 DIAGNOSIS — M79672 Pain in left foot: Secondary | ICD-10-CM | POA: Diagnosis not present

## 2015-05-25 DIAGNOSIS — Z3A10 10 weeks gestation of pregnancy: Secondary | ICD-10-CM | POA: Diagnosis not present

## 2015-05-25 DIAGNOSIS — Z36 Encounter for antenatal screening of mother: Secondary | ICD-10-CM | POA: Diagnosis not present

## 2015-05-25 DIAGNOSIS — O200 Threatened abortion: Secondary | ICD-10-CM | POA: Diagnosis not present

## 2015-06-09 DIAGNOSIS — Z3A12 12 weeks gestation of pregnancy: Secondary | ICD-10-CM | POA: Diagnosis not present

## 2015-06-09 DIAGNOSIS — Z36 Encounter for antenatal screening of mother: Secondary | ICD-10-CM | POA: Diagnosis not present

## 2015-06-09 DIAGNOSIS — O09212 Supervision of pregnancy with history of pre-term labor, second trimester: Secondary | ICD-10-CM | POA: Diagnosis not present

## 2015-07-03 DIAGNOSIS — H5213 Myopia, bilateral: Secondary | ICD-10-CM | POA: Diagnosis not present

## 2015-07-06 DIAGNOSIS — O09212 Supervision of pregnancy with history of pre-term labor, second trimester: Secondary | ICD-10-CM | POA: Diagnosis not present

## 2015-07-06 DIAGNOSIS — Z3A16 16 weeks gestation of pregnancy: Secondary | ICD-10-CM | POA: Diagnosis not present

## 2015-07-13 DIAGNOSIS — O09212 Supervision of pregnancy with history of pre-term labor, second trimester: Secondary | ICD-10-CM | POA: Diagnosis not present

## 2015-07-13 DIAGNOSIS — Z3A17 17 weeks gestation of pregnancy: Secondary | ICD-10-CM | POA: Diagnosis not present

## 2015-07-19 ENCOUNTER — Encounter (HOSPITAL_COMMUNITY): Payer: Self-pay | Admitting: *Deleted

## 2015-07-19 ENCOUNTER — Inpatient Hospital Stay (HOSPITAL_COMMUNITY)
Admission: AD | Admit: 2015-07-19 | Discharge: 2015-07-19 | Disposition: A | Discharge status: Home / Self Care | Payer: 59 | Source: Ambulatory Visit | Attending: Family Medicine | Admitting: Family Medicine

## 2015-07-19 DIAGNOSIS — Z3A18 18 weeks gestation of pregnancy: Secondary | ICD-10-CM

## 2015-07-19 DIAGNOSIS — O26892 Other specified pregnancy related conditions, second trimester: Secondary | ICD-10-CM | POA: Insufficient documentation

## 2015-07-19 DIAGNOSIS — K5900 Constipation, unspecified: Secondary | ICD-10-CM | POA: Diagnosis not present

## 2015-07-19 DIAGNOSIS — G250 Essential tremor: Secondary | ICD-10-CM | POA: Insufficient documentation

## 2015-07-19 DIAGNOSIS — R109 Unspecified abdominal pain: Secondary | ICD-10-CM | POA: Diagnosis present

## 2015-07-19 DIAGNOSIS — O9989 Other specified diseases and conditions complicating pregnancy, childbirth and the puerperium: Secondary | ICD-10-CM | POA: Diagnosis not present

## 2015-07-19 DIAGNOSIS — K5901 Slow transit constipation: Secondary | ICD-10-CM

## 2015-07-19 DIAGNOSIS — O99612 Diseases of the digestive system complicating pregnancy, second trimester: Secondary | ICD-10-CM

## 2015-07-19 DIAGNOSIS — M549 Dorsalgia, unspecified: Secondary | ICD-10-CM | POA: Insufficient documentation

## 2015-07-19 DIAGNOSIS — R102 Pelvic and perineal pain: Secondary | ICD-10-CM | POA: Diagnosis not present

## 2015-07-19 DIAGNOSIS — N949 Unspecified condition associated with female genital organs and menstrual cycle: Secondary | ICD-10-CM

## 2015-07-19 LAB — URINALYSIS, ROUTINE W REFLEX MICROSCOPIC
BILIRUBIN URINE: NEGATIVE
Glucose, UA: NEGATIVE mg/dL
HGB URINE DIPSTICK: NEGATIVE
KETONES UR: NEGATIVE mg/dL
Leukocytes, UA: NEGATIVE
Nitrite: NEGATIVE
Protein, ur: NEGATIVE mg/dL
Specific Gravity, Urine: 1.015 (ref 1.005–1.030)
pH: 8 (ref 5.0–8.0)

## 2015-07-19 NOTE — Discharge Instructions (Signed)
High-Fiber Diet Fiber, also called dietary fiber, is a type of carbohydrate found in fruits, vegetables, whole grains, and beans. A high-fiber diet can have many health benefits. Your health care provider may recommend a high-fiber diet to help:  Prevent constipation. Fiber can make your bowel movements more regular.  Lower your cholesterol.  Relieve hemorrhoids, uncomplicated diverticulosis, or irritable bowel syndrome.  Prevent overeating as part of a weight-loss plan.  Prevent heart disease, type 2 diabetes, and certain cancers. WHAT IS MY PLAN? The recommended daily intake of fiber includes:  38 grams for men under age 69.  28 grams for men over age 42.  16 grams for women under age 71.  86 grams for women over age 75. You can get the recommended daily intake of dietary fiber by eating a variety of fruits, vegetables, grains, and beans. Your health care provider may also recommend a fiber supplement if it is not possible to get enough fiber through your diet. WHAT DO I NEED TO KNOW ABOUT A HIGH-FIBER DIET?  Fiber supplements have not been widely studied for their effectiveness, so it is better to get fiber through food sources.  Always check the fiber content on thenutrition facts label of any prepackaged food. Look for foods that contain at least 5 grams of fiber per serving.  Ask your dietitian if you have questions about specific foods that are related to your condition, especially if those foods are not listed in the following section.  Increase your daily fiber consumption gradually. Increasing your intake of dietary fiber too quickly may cause bloating, cramping, or gas.  Drink plenty of water. Water helps you to digest fiber. WHAT FOODS CAN I EAT? Grains Whole-grain breads. Multigrain cereal. Oats and oatmeal. Brown rice. Barley. Bulgur wheat. [place]. Bran muffins. Popcorn. Rye wafer crackers. Vegetables Sweet potatoes. Spinach. Kale. Artichokes. Cabbage. Broccoli.  Green peas. Carrots. Squash. Fruits Berries. Pears. Apples. Oranges. Avocados. Prunes and raisins. Dried figs. Meats and Other Protein Sources [place], kidney, pinto, and soy beans. Split peas. Lentils. Nuts and seeds. Dairy Fiber-fortified yogurt. Beverages Fiber-fortified soy milk. Fiber-fortified orange juice. Other Fiber bars. The items listed above may not be a complete list of recommended foods or beverages. Contact your dietitian for more options. WHAT FOODS ARE NOT RECOMMENDED? Grains Vogt bread. Pasta made with refined flour. Searight rice. Vegetables Fried potatoes. Canned vegetables. Well-cooked vegetables.  Fruits Fruit juice. Cooked, strained fruit. Meats and Other Protein Sources Fatty cuts of meat. Fried Sales executive or fried fish. Dairy Milk. Yogurt. Cream cheese. Sour cream. Beverages Soft drinks. Other Cakes and pastries. Butter and oils. The items listed above may not be a complete list of foods and beverages to avoid. Contact your dietitian for more information. WHAT ARE SOME TIPS FOR INCLUDING HIGH-FIBER FOODS IN MY DIET?  Eat a wide variety of high-fiber foods.  Make sure that half of all grains consumed each day are whole grains.  Replace breads and cereals made from refined flour or Brett flour with whole-grain breads and cereals.  Replace Kiedrowski rice with brown rice, bulgur wheat, or millet.  Start the day with a breakfast that is high in fiber, such as a cereal that contains at least 5 grams of fiber per serving.  Use beans in place of meat in soups, salads, or pasta.  Eat high-fiber snacks, such as berries, raw vegetables, nuts, or popcorn.   This information is not intended to replace advice given to you by your health care provider. Make sure you discuss  any questions you have with your health care provider.   Document Released: 02/06/2005 Document Revised: 02/27/2014 Document Reviewed: 07/22/2013 Elsevier Interactive Patient Education 2016 [employer].  Constipation, Adult Constipation is when a person:  Poops (has a bowel movement) less than 3 times a week.  Has a hard time pooping.  Has poop that is dry, hard, or bigger than normal. HOME CARE   Eat foods with a lot of fiber in them. This includes fruits, vegetables, beans, and whole grains such as brown rice.  Avoid fatty foods and foods with a lot of sugar. This includes french fries, hamburgers, cookies, candy, and soda.  If you are not getting enough fiber from food, take products with added fiber in them (supplements).  Drink enough fluid to keep your pee (urine) clear or pale yellow.  Exercise on a regular basis, or as told by your doctor.  Go to the restroom when you feel like you need to poop. Do not hold it.  Only take medicine as told by your doctor. Do not take medicines that help you poop (laxatives) without talking to your doctor first. GET HELP RIGHT AWAY IF:   You have bright red blood in your poop (stool).  Your constipation lasts more than 4 days or gets worse.  You have belly (abdominal) or butt (rectal) pain.  You have thin poop (as thin as a pencil).  You lose weight, and it cannot be explained. MAKE SURE YOU:   Understand these instructions.  Will watch your condition.  Will get help right away if you are not doing well or get worse.   This information is not intended to replace advice given to you by your health care provider. Make sure you discuss any questions you have with your health care provider.   Document Released: 07/26/2007 Document Revised: 02/27/2014 Document Reviewed: 11/18/2012 Elsevier Interactive Patient Education 2016 Else[employer] Inc[employer]  Round Ligament Pain During Pregnancy   Round ligament pain is a sharp pain or jabbing feeling often felt in the lower belly or groin area on one or both sides. It is one of the most common complaints during pregnancy and is considered a normal part of pregnancy. It is most often felt during  the second trimester.   Here is what you need to know about round ligament pain, including some tips to help you feel better.   Causes of Round Ligament Pain:    Several thick ligaments surround and support your womb (uterus) as it grows during pregnancy. One of them is called the round ligament.   The round ligament connects the front part of the womb to your groin, the area where your legs attach to your pelvis. The round ligament normally tightens and relaxes slowly.   As your baby and womb grow, the round ligament stretches. That makes it more likely to become strained.   Sudden movements can cause the ligament to tighten quickly, like a rubber band snapping. This causes a sudden and quick jabbing feeling.   Symptoms of Round Ligament Pain   Round ligament pain can be concerning and uncomfortable. But it is considered normal as your body changes during pregnancy.   The symptoms of round ligament pain include a sharp, sudden spasm in the belly. It usually affects the right side, but it may happen on both sides. The pain only lasts a few seconds.   Exercise may cause the pain, as will rapid movements such as:   sneezing  coughing  laughing  rolling over in bed  standing up too quickly   Treatment of Round Ligament Pain   Here are some tips that may help reduce your discomfort:   Pain relief. Take over-the-counter acetaminophen for pain, if necessary. Ask your doctor if this is OK.   Exercise. Get plenty of exercise to keep your stomach (core) muscles strong. Doing stretching exercises or prenatal yoga can be helpful. Ask your doctor which exercises are safe for you and your baby.   A helpful exercise involves putting your hands and knees on the floor, lowering your head, and pushing your backside into the air.   Avoid sudden movements. Change positions slowly (such as standing up or sitting down) to avoid sudden movements that may cause stretching and pain.   Flex your hips.  Bend and flex your hips before you cough, sneeze, or laugh to avoid pulling on the ligaments.   Apply warmth. A heating pad or warm bath may be helpful. Ask your doctor if this is OK. Extreme heat can be dangerous to the baby.   You should try to modify your daily activity level and avoid positions that may worsen the condition.   When to Call the Doctor/Midwife   Always tell your doctor or midwife about any type of pain you have during pregnancy. Round ligament pain is quick and doesn't last long.   Call your health care provider immediately if you have:   severe pain  fever  chills  pain on urination  difficulty walking   Belly pain during pregnancy can be due to many different causes. It is important for your doctor to rule out more serious conditions, including pregnancy complications such as placenta abruption or non-pregnancy illnesses such as:   inguinal hernia  appendicitis  stomach, liver, and kidney problems  Preterm labor pains may sometimes be mistaken for round ligament pain.

## 2015-07-19 NOTE — MAU Note (Signed)
Pt states she has been having sharp pain in her lower abd, pelvic pressure, and lower back pain for the last week, worseing today.

## 2015-07-19 NOTE — MAU Provider Note (Signed)
History     CSN: [id number]  Arrival date and time: 07/19/15 2246   First Provider Initiated Contact with Patient 07/19/15 2321      Chief Complaint  Patient presents with  . Abdominal Pain   HPI  [name] is a 35 y.o. [id number] at [id number] who presents to MAU today with complaint of abdominal and back pain x 1 week, but worse for 2-3 days. The patient states that [facility]. She states that pain is sharp in the LLQ and pressure in the pelvis and lower back. She also notes new onset vaginal pain over the last few days. The pain is worse with standing for long periods of time and sometimes with laying down. She denies UTI symptoms, vaginal discharge, bleeding, fever, N/V/D today. She does endorse chronic constipation due to Iron supplements. She took a laxative today and had some results. She rates pain at 6/10 now and has not taken anything for pain. She has had 2 NSVD with previous pregnancies.   OB History    Gravida Para Term Preterm AB TAB SAB Ectopic Multiple Living   6 2 1 1 3 3           Past Medical History  Diagnosis Date  . Acne   . Essential tremor     Dr. Terrace ArabiaYan, neurology  . History of abnormal Pap smear   . History of UTI   . History of trichomonal vaginitis   . Menorrhagia     prior trials of Depo Provera, Ortho Tri Cyclen Lo  . Anemia     hx/o iron deficiency anemia  . Yeast vaginitis     recurrent    Past Surgical History  Procedure Laterality Date  . Appendectomy    . Wisdom tooth extraction    . Dilation and evacuation    . Laparoscopic unilateral salpingo oopherectomy    . Laparoscopy N/A 10/04/2013    Procedure: LAPAROSCOPY OPERATIVE, Removal of Right Fallopian Tube and Ovary;  Surgeon: Catalina AntiguaPeggy Constant, MD;  Location: [facility];  Service: Gynecology;  Laterality: N/A;    History reviewed. No pertinent family history.  Social History  Substance Use Topics  . Smoking status: Never Smoker   . Smokeless tobacco: Never  Used  . Alcohol Use: No     Comment: occ    Allergies: No Known Allergies  No prescriptions prior to admission    Review of Systems  Constitutional: Negative for fever and malaise/fatigue.  Gastrointestinal: Positive for abdominal pain and constipation. Negative for nausea, vomiting and diarrhea.  Genitourinary: Negative for dysuria, urgency and frequency.       Neg - vaginal bleeding, discharge   Physical Exam   Blood pressure 123/64, pulse 110, temperature 98.6 F (37 C), temperature source Oral, resp. rate 17, height 5\' 1"  (1.549 m), weight 144 lb (65.318 kg), last menstrual period 10/19/2014, SpO2 100 %.  Physical Exam  Nursing note and vitals reviewed. Constitutional: She is oriented to person, place, and time. She appears well-developed and well-nourished. No distress.  HENT:  Head: Normocephalic and atraumatic.  Cardiovascular: Normal rate.   Respiratory: Effort normal.  GI: Soft. She exhibits no distension and no mass. There is tenderness (mild LLQ abdominal tenderness to palpation). There is no rebound and no guarding.  Neurological: She is alert and oriented to person, place, and time.  Skin: Skin is warm and dry. No erythema.  Psychiatric: She has a normal mood and affect.  Dilation: Fingertip  Effacement (%): Thick Cervical Position: Posterior Exam by:: Harlon Flor PA   Results for orders placed or performed during the hospital encounter of 07/19/15 (from the past 24 hour(s))  Urinalysis, Routine w reflex microscopic (not at [facility])     Status: None   Collection Time: 07/19/15 11:06 PM  Result Value Ref Range   Color, Urine YELLOW YELLOW   APPearance CLEAR CLEAR   Specific Gravity, Urine 1.015 1.005 - 1.030   pH 8.0 5.0 - 8.0   Glucose, UA NEGATIVE NEGATIVE mg/dL   Hgb urine dipstick NEGATIVE NEGATIVE   Bilirubin Urine NEGATIVE NEGATIVE   Ketones, ur NEGATIVE NEGATIVE mg/dL   Protein, ur NEGATIVE NEGATIVE mg/dL   Nitrite NEGATIVE NEGATIVE   Leukocytes, UA  NEGATIVE NEGATIVE    MAU Course  Procedures None  MDM FHR - 152 bpm with doppler UA today without evidence of dehydration or infection  Assessment and Plan  A: Round ligament pain Pubic symphysis pain Constipation, chornic Back pain in pregnancy  P: Discharge home Advised Tylenol PRN for pain Discussed continued use of laxatives and/or stool softener for constipation Advised incresaed PO hydration as tolerated Dicussed use of abdominal binder, warm bath/shower and moderation of activity for abdominal and back pain Second trimester precautions discussed Patient advised to follow-up with OB provider in [place] as scheduled or sooner PRN Patient may return to MAU as needed or if her condition were to change or worsen   Marny Lowenstein, PA-C  07/20/2015, 12:03 AM

## 2015-07-23 DIAGNOSIS — O200 Threatened abortion: Secondary | ICD-10-CM | POA: Diagnosis not present

## 2015-07-23 DIAGNOSIS — Z3A18 18 weeks gestation of pregnancy: Secondary | ICD-10-CM | POA: Diagnosis not present

## 2015-07-23 DIAGNOSIS — O09212 Supervision of pregnancy with history of pre-term labor, second trimester: Secondary | ICD-10-CM | POA: Diagnosis not present

## 2015-07-30 DIAGNOSIS — Z3A19 19 weeks gestation of pregnancy: Secondary | ICD-10-CM | POA: Diagnosis not present

## 2015-07-30 DIAGNOSIS — O09212 Supervision of pregnancy with history of pre-term labor, second trimester: Secondary | ICD-10-CM | POA: Diagnosis not present

## 2015-08-04 DIAGNOSIS — O09212 Supervision of pregnancy with history of pre-term labor, second trimester: Secondary | ICD-10-CM | POA: Diagnosis not present

## 2015-08-04 DIAGNOSIS — Z3A20 20 weeks gestation of pregnancy: Secondary | ICD-10-CM | POA: Diagnosis not present

## 2015-08-04 DIAGNOSIS — Z36 Encounter for antenatal screening of mother: Secondary | ICD-10-CM | POA: Diagnosis not present

## 2015-08-04 MED FILL — PROGESTERONE 200 MG CAPSULE: 200 | 30 days supply | Qty: 30 | Fill #0

## 2015-08-20 DIAGNOSIS — O0992 Supervision of high risk pregnancy, unspecified, second trimester: Secondary | ICD-10-CM | POA: Diagnosis not present

## 2015-08-20 DIAGNOSIS — Z36 Encounter for antenatal screening of mother: Secondary | ICD-10-CM | POA: Diagnosis not present

## 2015-08-20 DIAGNOSIS — O09212 Supervision of pregnancy with history of pre-term labor, second trimester: Secondary | ICD-10-CM | POA: Diagnosis not present

## 2015-08-20 DIAGNOSIS — Z3A22 22 weeks gestation of pregnancy: Secondary | ICD-10-CM | POA: Diagnosis not present

## 2015-09-03 DIAGNOSIS — O09212 Supervision of pregnancy with history of pre-term labor, second trimester: Secondary | ICD-10-CM | POA: Diagnosis not present

## 2015-09-03 DIAGNOSIS — Z3A24 24 weeks gestation of pregnancy: Secondary | ICD-10-CM | POA: Diagnosis not present

## 2015-09-03 MED FILL — PROGESTERONE 200 MG CAPSULE: 200 | 30 days supply | Qty: 30 | Fill #1

## 2015-09-14 DIAGNOSIS — O3412 Maternal care for benign tumor of corpus uteri, second trimester: Secondary | ICD-10-CM | POA: Diagnosis not present

## 2015-09-14 DIAGNOSIS — O09212 Supervision of pregnancy with history of pre-term labor, second trimester: Secondary | ICD-10-CM | POA: Diagnosis not present

## 2015-09-14 DIAGNOSIS — Z3A26 26 weeks gestation of pregnancy: Secondary | ICD-10-CM | POA: Diagnosis not present

## 2015-10-01 DIAGNOSIS — O360930 Maternal care for other rhesus isoimmunization, third trimester, not applicable or unspecified: Secondary | ICD-10-CM | POA: Diagnosis not present

## 2015-10-01 DIAGNOSIS — Z36 Encounter for antenatal screening of mother: Secondary | ICD-10-CM | POA: Diagnosis not present

## 2015-10-01 DIAGNOSIS — O09893 Supervision of other high risk pregnancies, third trimester: Secondary | ICD-10-CM | POA: Diagnosis not present

## 2015-10-01 DIAGNOSIS — Z3A28 28 weeks gestation of pregnancy: Secondary | ICD-10-CM | POA: Diagnosis not present

## 2015-10-05 MED FILL — PROGESTERONE 200 MG CAPSULE: 200 | 30 days supply | Qty: 30 | Fill #2

## 2015-11-02 MED FILL — PROGESTERONE 200 MG CAPSULE: 200 | 30 days supply | Qty: 30 | Fill #3

## 2015-11-12 DIAGNOSIS — Z23 Encounter for immunization: Secondary | ICD-10-CM | POA: Diagnosis not present

## 2015-11-19 DIAGNOSIS — Z3A36 36 weeks gestation of pregnancy: Secondary | ICD-10-CM | POA: Diagnosis not present

## 2015-11-19 DIAGNOSIS — Z36 Encounter for antenatal screening of mother: Secondary | ICD-10-CM | POA: Diagnosis not present

## 2015-11-25 MED FILL — HYDROCODON-APAP 5-325: 5-325 | 3 days supply | Qty: 10 | Fill #0

## 2015-11-26 MED FILL — ANUCORT-HC 25 MG SUPP: 25 | 15 days supply | Qty: 15 | Fill #0

## 2015-12-16 DIAGNOSIS — Z3A39 39 weeks gestation of pregnancy: Secondary | ICD-10-CM | POA: Diagnosis not present

## 2015-12-20 MED FILL — OXYCODONE W/APAP 5/325 TAB: 5-325 | 7 days supply | Qty: 20 | Fill #0

## 2015-12-20 MED FILL — ANUCORT-HC 25 MG SUPP: 25 | 15 days supply | Qty: 15 | Fill #1

## 2015-12-20 MED FILL — NORETHIN-ESTRAD-FERR 1-0.02: 1-20 | 84 days supply | Qty: 84 | Fill #0

## 2016-05-23 ENCOUNTER — Encounter (HOSPITAL_COMMUNITY): Payer: Self-pay

## 2017-04-02 DIAGNOSIS — Z01419 Encounter for gynecological examination (general) (routine) without abnormal findings: Secondary | ICD-10-CM | POA: Diagnosis not present

## 2017-04-02 DIAGNOSIS — Z30014 Encounter for initial prescription of intrauterine contraceptive device: Secondary | ICD-10-CM | POA: Diagnosis not present

## 2017-04-20 MED FILL — miSOPROStol 200 MCG TABS: 200 | 2 days supply | Qty: 4 | Fill #0

## 2017-05-01 DIAGNOSIS — Z3202 Encounter for pregnancy test, result negative: Secondary | ICD-10-CM | POA: Diagnosis not present

## 2017-05-01 DIAGNOSIS — Z30430 Encounter for insertion of intrauterine contraceptive device: Secondary | ICD-10-CM | POA: Diagnosis not present

## 2017-06-18 ENCOUNTER — Ambulatory Visit: Payer: 59 | Admitting: Family Medicine

## 2017-06-18 ENCOUNTER — Encounter: Payer: Self-pay | Admitting: Family Medicine

## 2017-06-18 VITALS — BP 118/70 | HR 72 | Temp 98.6°F | Resp 12 | Ht 61.0 in | Wt 136.0 lb

## 2017-06-18 DIAGNOSIS — Z111 Encounter for screening for respiratory tuberculosis: Secondary | ICD-10-CM

## 2017-06-18 DIAGNOSIS — E538 Deficiency of other specified B group vitamins: Secondary | ICD-10-CM

## 2017-06-18 DIAGNOSIS — Z0000 Encounter for general adult medical examination without abnormal findings: Secondary | ICD-10-CM | POA: Diagnosis not present

## 2017-06-18 DIAGNOSIS — E785 Hyperlipidemia, unspecified: Secondary | ICD-10-CM | POA: Diagnosis not present

## 2017-06-18 DIAGNOSIS — Z131 Encounter for screening for diabetes mellitus: Secondary | ICD-10-CM

## 2017-06-18 NOTE — Patient Instructions (Signed)
A few things to remember from today's visit:   Routine general medical examination at a health care facility  Diabetes mellitus screening - Plan: Basic metabolic panel, Hemoglobin A1c  Hyperlipidemia, unspecified hyperlipidemia type - Plan: Lipid panel  Screening-pulmonary TB - Plan: Quantiferon tb gold assay   Please be sure medication list is accurate. If a new problem present, please set up appointment sooner than planned today.        Today you have you routine preventive visit.  At least 150 minutes of moderate exercise per week, daily brisk walking for 15-30 min is a good exercise option. Healthy diet low in saturated (animal) fats and sweets and consisting of fresh fruits and vegetables, lean meats such as fish and [place] chicken and whole grains.  These are some of recommendations for screening depending of age and risk factors:   - Vaccines:  Tdap vaccine every 10 years.  Shingles vaccine recommended at age 78, could be given after 35 years of age but not sure about insurance coverage.   Pneumonia vaccines:  Prevnar 13 at 65 and Pneumovax at 66. Sometimes Pneumovax is giving earlier if history of smoking, lung disease,diabetes,kidney disease among some.    Screening for diabetes at age 50 and every 3 years.  Cervical cancer prevention:  Pap smear starts at 35 years of age and continues periodically until 35 years old in low risk women. Pap smear every 3 years between 58 and 48 years old. Pap smear every 3-5 years between women 30 and older if pap smear negative and HPV screening negative.   -Breast cancer: Mammogram: There is disagreement between experts about when to start screening in low risk asymptomatic female but recent recommendations are to start screening at 80 and not later than 35 years old , every 1-2 years and after 35 yo q 2 years. Screening is recommended until 35 years old but some women can continue screening depending of healthy  issues.   Colon cancer screening: starts at 35 years old until 35 years old.   Also recommended:  1. Dental visit- Brush and floss your teeth twice daily; visit your dentist twice a year. 2. Eye doctor- Get an eye exam at least every 2 years. 3. Helmet use- Always wear a helmet when riding a bicycle, motorcycle, rollerblading or skateboarding. 4. Safe sex- If you may be exposed to sexually transmitted infections, use a condom. 5. Seat belts- Seat belts can save your live; always wear one. 6. Smoke/Carbon Monoxide detectors- These detectors need to be installed on the appropriate level of your home. Replace batteries at least once a year. 7. Skin cancer- When out in the sun please cover up and use sunscreen 15 SPF or higher. 8. Violence- If anyone is threatening or hurting you, please tell your healthcare provider.  9. Drink alcohol in moderation- Limit alcohol intake to one drink or less per day. Never drink and drive.

## 2017-06-18 NOTE — Progress Notes (Signed)
HPI:   [name] is a 37 y.o. female, who is here today to establish care.  Former PCP: Dr Marlyne Beards Last preventive routine visit: Gyn routine examination in 03/2017.  Chronic medical problems: Anxiety (situational),she was on Propranolol prn, improved after finishing college. B12 def (04/2015 it was 174).She is not on B12 supplementation. Irregular menses , Mirena has helped.  Also reporting Hx of mildly elevated cholesterol. She tries to follow a low fat diet. Glucose in 09/2013 was elevated at 134.  Concerns today: She needs a physical done and form completed. She also needs TB screening done.  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 She lives with her 2 sons (18 months and 41 yo). She does not exercise regularly but tries to follow a healthy diet.    Review of Systems  Constitutional: Negative for appetite change, fatigue, fever and unexpected weight change.  HENT: Negative for dental problem, hearing loss, mouth sores, sore throat, trouble swallowing and voice change.   Eyes: Negative for redness and visual disturbance.  Respiratory: Negative for cough, shortness of breath and wheezing.   Cardiovascular: Negative for chest pain and leg swelling.  Gastrointestinal: Negative for abdominal pain, nausea and vomiting.       No changes in bowel habits.  Endocrine: Negative for cold intolerance, heat intolerance, polydipsia, polyphagia and polyuria.  Genitourinary: Negative for decreased urine volume, dysuria, hematuria, vaginal bleeding and vaginal discharge.  Musculoskeletal: Negative for gait problem and myalgias.  Skin: Negative for color change and rash.  Allergic/Immunologic: Positive for environmental allergies.  Neurological: Negative for syncope, weakness and headaches.  Hematological: Negative for adenopathy. Does not bruise/bleed easily.  Psychiatric/Behavioral: Negative for confusion and sleep disturbance. The patient is not  nervous/anxious.   All other systems reviewed and are negative.     Current Outpatient Medications on File Prior to Visit  Medication Sig Dispense Refill  . levonorgestrel (MIRENA) 20 MCG/24HR IUD 1 each by Intrauterine route once.     No current facility-administered medications on file prior to visit.      Past Medical History:  Diagnosis Date  . Acne   . Anemia    hx/o iron deficiency anemia  . Essential tremor    Dr. Terrace Arabia, neurology  . History of abnormal Pap smear   . History of trichomonal vaginitis   . History of UTI   . Menorrhagia    prior trials of Depo Provera, Ortho Tri Cyclen Lo  . Yeast vaginitis    recurrent   No Known Allergies  Family History  Problem Relation Age of Onset  . Stroke Mother   . Alcohol abuse Mother   . Diabetes Mother   . Drug abuse Mother   . Hyperlipidemia Mother   . Hypertension Mother   . Alcohol abuse Father   . Drug abuse Father   . Hyperlipidemia Father   . Hypertension Father   . Cancer Son   . Stroke Paternal Uncle   . Early death Maternal Grandmother   . Heart attack Maternal Grandmother     Social History   Socioeconomic History  . Marital status: Single    Spouse name: Not on file  . Number of children: 2  . Years of education: Not on file  . Highest education level: Bachelor's degree (e.g., BA, AB, BS)  Occupational History  . Not on file  Social Needs  . Financial resource strain: Not on file  . Food insecurity:  Worry: Not on file    Inability: Not on file  . Transportation needs:    Medical: Not on file    Non-medical: Not on file  Tobacco Use  . Smoking status: Never Smoker  . Smokeless tobacco: Never Used  Substance and Sexual Activity  . Alcohol use: Not on file    Comment: occ  . Drug use: No  . Sexual activity: Yes    Birth control/protection: IUD  Lifestyle  . Physical activity:    Days per week: Not on file    Minutes per session: Not on file  . Stress: Not on file  Relationships    . Social connections:    Talks on phone: Not on file    Gets together: Not on file    Attends religious service: Not on file    Active member of club or organization: Not on file    Attends meetings of clubs or organizations: Not on file    Relationship status: Not on file  Other Topics Concern  . 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   Physical Exam  Nursing note and vitals reviewed. Constitutional: She is oriented to person, place, and time. She appears well-developed. No distress.  HENT:  Head: Normocephalic and atraumatic.  Right Ear: Hearing, tympanic membrane, external ear and ear canal normal.  Left Ear: Hearing, tympanic membrane, external ear and ear canal normal.  Mouth/Throat: Uvula is midline, oropharynx is clear and moist and mucous membranes are normal.  Eyes: Pupils are equal, round, and reactive to light. Conjunctivae and EOM are normal.  Neck: No tracheal deviation present. No thyromegaly present.  Cardiovascular: Normal rate and regular rhythm.  No murmur heard. Pulses:      Dorsalis pedis pulses are 2+ on the right side, and 2+ on the left side.  Respiratory: Effort normal and breath sounds normal. No respiratory distress.  GI: Soft. She exhibits no mass. There is no hepatomegaly. There is no tenderness.  Musculoskeletal: She exhibits no edema or tenderness.  No major deformity or signs of synovitis appreciated.  Lymphadenopathy:    She has no cervical adenopathy.       Right: No supraclavicular adenopathy present.       Left: No supraclavicular adenopathy present.  Neurological: She is alert and oriented to person, place, and time. She has normal strength. No cranial nerve deficit. Coordination and gait normal.   Reflex Scores:      Bicep reflexes are 2+ on the right side and 2+ on the left side.      Patellar reflexes are 2+ on the right side and 2+ on the left side. Skin: Skin is warm. No rash noted. No erythema.  Psychiatric: She has a normal mood and affect. Her speech is normal.  Well groomed, good eye contact.    ASSESSMENT AND PLAN:   [name] was seen today for establish care and annual exam.  Diagnoses and all orders for this visit:  Lab Results  Component Value Date   CHOL 186 06/19/2017   HDL 46.20 06/19/2017   LDLCALC 129 (H) 06/19/2017   TRIG 57.0 06/19/2017   CHOLHDL 4 06/19/2017   Lab Results  Component Value Date   CREATININE 0.77 06/19/2017   BUN 11 06/19/2017   NA 139 06/19/2017   K 4.1 06/19/2017   CL 106 06/19/2017   CO2 27 06/19/2017   Lab Results  Component Value Date   VITAMINB12 234 06/19/2017   Lab Results  Component Value Date   HGBA1C 5.7 06/19/2017    Routine general medical examination at a health care facility  We discussed the importance of regular physical activity and healthy diet for prevention of chronic illness and/or complications. Preventive guidelines reviewed. Vaccination up to date ,per pt report. For female preventive care she will continue following with her gyn, Dr Loreta Ave. Next CPE in 1-2  years.   Form completed.   Diabetes mellitus screening -     Basic metabolic panel; Future -     Hemoglobin A1c; Future  Hyperlipidemia, unspecified hyperlipidemia type  Continue non pharmacologic treatment for now. Further recommendations will be given depending on FLP results.  -     Lipid panel; Future   Screening-pulmonary TB -     QuantiFERON-TB Gold Plus  B12 deficiency  Further recommendations will be given according to B12 results.  -     Vitamin B12; Future      Betty G. [place], MD  [facility]. [place] office.

## 2017-06-19 DIAGNOSIS — Z111 Encounter for screening for respiratory tuberculosis: Secondary | ICD-10-CM | POA: Diagnosis not present

## 2017-06-19 LAB — LIPID PANEL
CHOL/HDL RATIO: 4
Cholesterol: 186 mg/dL (ref 0–200)
HDL: 46.2 mg/dL (ref >39.00)
LDL Cholesterol: 129 mg/dL — ABNORMAL HIGH (ref 0–99)
NONHDL: 140.01
Triglycerides: 57 mg/dL (ref 0.0–149.0)
VLDL: 11.4 mg/dL (ref 0.0–40.0)

## 2017-06-19 LAB — BASIC METABOLIC PANEL
BUN: 11 mg/dL (ref 6–23)
CHLORIDE: 106 meq/L (ref 96–112)
CO2: 27 mEq/L (ref 19–32)
Calcium: 9.3 mg/dL (ref 8.4–10.5)
Creatinine, Ser: 0.77 mg/dL (ref 0.40–1.20)
GFR: 109.89 mL/min (ref >60.00)
Glucose, Bld: 78 mg/dL (ref 70–99)
Potassium: 4.1 mEq/L (ref 3.5–5.1)
Sodium: 139 mEq/L (ref 135–145)

## 2017-06-19 LAB — HEMOGLOBIN A1C: Hgb A1c MFr Bld: 5.7 % (ref 4.6–6.5)

## 2017-06-19 LAB — VITAMIN B12: Vitamin B-12: 234 pg/mL (ref 211–911)

## 2017-06-20 ENCOUNTER — Telehealth: Payer: Self-pay | Admitting: Family Medicine

## 2017-06-20 NOTE — Telephone Encounter (Signed)
Copied from CRM [id number]. Topic: Quick Communication - See Telephone Encounter >> Jun 20, 2017 11:14 AM Diana Eves B wrote: CRM for notification. See Telephone encounter for: 06/20/17.  Pt is requesting her QuantiFERON-TB Gold Plus results be faxed over to [phone].

## 2017-06-21 LAB — QUANTIFERON-TB GOLD PLUS
Mitogen-NIL: >10 IU/mL
NIL: 0.01 IU/mL
QUANTIFERON-TB GOLD PLUS: NEGATIVE
TB1-NIL: 0.01 IU/mL
TB2-NIL: 0 IU/mL

## 2017-06-22 NOTE — Telephone Encounter (Signed)
Results released to my chart by [facility].

## 2017-06-22 NOTE — Telephone Encounter (Signed)
Copied from CRM [id number]. Topic: Quick Communication - See Telephone Encounter >> Jun 20, 2017 11:14 AM Diana Eves B wrote: CRM for notification. See Telephone encounter for: 06/20/17.  Pt is requesting her QuantiFERON-TB Gold Plus results be faxed over to [phone].  >> Jun 21, 2017 11:51 AM Cipriano Bunker wrote: Pt. Said don't fax now but post in [place]  Can call her with results too

## 2017-06-24 ENCOUNTER — Encounter: Payer: Self-pay | Admitting: Family Medicine

## 2017-07-23 DIAGNOSIS — H5213 Myopia, bilateral: Secondary | ICD-10-CM | POA: Diagnosis not present

## 2017-08-15 ENCOUNTER — Encounter: Payer: Self-pay | Admitting: Family Medicine

## 2017-08-15 ENCOUNTER — Ambulatory Visit: Payer: 59 | Admitting: Family Medicine

## 2017-08-15 VITALS — BP 118/70 | HR 86 | Temp 99.0°F | Resp 12 | Ht 61.0 in | Wt 138.0 lb

## 2017-08-15 DIAGNOSIS — M542 Cervicalgia: Secondary | ICD-10-CM

## 2017-08-15 DIAGNOSIS — F419 Anxiety disorder, unspecified: Secondary | ICD-10-CM | POA: Diagnosis not present

## 2017-08-15 DIAGNOSIS — M549 Dorsalgia, unspecified: Secondary | ICD-10-CM | POA: Diagnosis not present

## 2017-08-15 MED ORDER — BUSPIRONE HCL 10 MG PO TABS
10.0000 mg | ORAL_TABLET | Freq: Two times a day (BID) | ORAL | 1 refills | Status: DC
Start: 2017-08-15 — End: 2018-04-01

## 2017-08-15 MED ORDER — TIZANIDINE HCL 4 MG PO TABS: 4.0000 mg | ORAL_TABLET | Freq: Every day | ORAL | 1 refills | Status: DC

## 2017-08-15 MED FILL — busPIRone HCL 10 MG TABS: 10 | 30 days supply | Qty: 60 | Fill #0

## 2017-08-15 MED FILL — tiZANidine HCL 4 MG TABS: 4 | 30 days supply | Qty: 30 | Fill #0

## 2017-08-15 NOTE — Progress Notes (Signed)
ACUTE VISIT   HPI:  Chief Complaint  Patient presents with  . Back Pain    x 2 weeks, upper back pain, mid right shoudler pain  . Neck Pain    [name] is a 37 y.o. female, who is here today complaining of 2 weeks of neck pain. She is reporting this as a new problem. Also having right thoracic pain.  She denies burning or tingling sensation. She has constant achy, exacerbated by movement, she feels like muscles are "tightening up." No alleviating factors identified.  Pain is not radiated, 5-10/10. She has tried OTC Motrin, last time she took medication was a couple days ago.  She is also applying local heat intermittently.  She feels like pain is getting worse.  Occasionally she has occipital pressure headache.  No history of trauma. She has not noted local edema, erythema, or rash.  Anxiety: Today she is requesting medication for anxiety, BuSpar.  She has not taking BuSpar for over 4 years.  She has known history of anxiety, she denies depression or suicidal thoughts. Problem is getting worse. She has not identified any specific trigger factors. She has some anxiety when she attends social events.  She has not tried other medications for anxiety.    Review of Systems  Constitutional: Negative for appetite change, fatigue and fever.  HENT: Negative for mouth sores, sore throat and trouble swallowing.   Respiratory: Negative for chest tightness, shortness of breath and wheezing.   Cardiovascular: Negative for palpitations.  Gastrointestinal: Negative for nausea and vomiting.  Musculoskeletal: Positive for neck pain. Negative for gait problem and joint swelling.  Skin: Negative for pallor and rash.  Neurological: Positive for headaches. Negative for syncope, weakness and numbness.  Hematological: Negative for adenopathy.  Psychiatric/Behavioral: Negative for confusion and suicidal ideas. The patient is nervous/anxious.       Current  Outpatient Medications on File Prior to Visit  Medication Sig Dispense Refill  . levonorgestrel (MIRENA) 20 MCG/24HR IUD 1 each by Intrauterine route once.     No current facility-administered medications on file prior to visit.      Past Medical History:  Diagnosis Date  . Acne   . Anemia    hx/o iron deficiency anemia  . Essential tremor    Dr. Terrace ArabiaYan, neurology  . History of abnormal Pap smear   . History of trichomonal vaginitis   . History of UTI   . Menorrhagia    prior trials of Depo Provera, Ortho Tri Cyclen Lo  . Yeast vaginitis    recurrent   No Known Allergies  Social History   Socioeconomic History  . Marital status: Single    Spouse name: Not on file  . Number of children: 2  . Years of education: Not on file  . Highest education level: Bachelor's degree (e.g., BA, AB, BS)  Occupational History  . Not on file  Social Needs  . Financial resource strain: Not on file  . Food insecurity:    Worry: Not on file    Inability: Not on file  . Transportation needs:    Medical: Not on file    Non-medical: Not on file  Tobacco Use  . Smoking status: Never Smoker  . Smokeless tobacco: Never Used  Substance and Sexual Activity  . Alcohol use: Not on file    Comment: occ  . Drug use: No  . Sexual activity: Yes    Birth control/protection: IUD  Lifestyle  . Physical  activity:    Days per week: Not on file    Minutes per session: Not on file  . Stress: Not on file  Relationships  . Social connections:    Talks on phone: Not on file    Gets together: Not on file    Attends religious service: Not on file    Active member of club or organization: Not on file    Attends meetings of clubs or organizations: Not on file    Relationship status: Not on file  Other Topics Concern  . 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    Physical Exam  Nursing note and vitals reviewed. Constitutional: She is oriented to person, place, and time. She appears well-developed and well-nourished. She does not appear ill. No distress.  HENT:  Head: Normocephalic and atraumatic.  Eyes: Pupils are equal, round, and reactive to light. Conjunctivae are normal.  Cardiovascular: Normal rate and regular rhythm.  No murmur heard. Respiratory: Effort normal and breath sounds normal. No respiratory distress.  Musculoskeletal: She exhibits no edema.       Cervical back: She exhibits tenderness (with flexion). She exhibits normal range of motion and no bony tenderness.       Thoracic back: She exhibits no tenderness and no bony tenderness.       Lumbar back: She exhibits no tenderness and no bony tenderness.  No significant deformity appreciated. No tenderness upon palpation of paraspinal muscles. Right interscapular pain is elicited by shoulder abduction, no limitation of range of motion.  No local edema or erythema appreciated, no suspicious lesions.  Lymphadenopathy:    She has no cervical adenopathy.  Neurological: She is alert and oriented to person, place, and time. She has normal strength. Gait normal.  Reflex Scores:      Patellar reflexes are 2+ on the right side and 2+ on the left side.      Achilles reflexes are 2+ on the right side and 2+ on the left side. Skin: Skin is warm. No rash noted. No erythema.  Psychiatric: Her mood appears anxious. She expresses no suicidal ideation.  Well groomed, good eye contact.      ASSESSMENT AND PLAN:   Carolyn Pierce was seen today for back pain and neck pain.  Diagnoses and all orders for this visit:  Neck pain  ROM exercises, OTC Motrin 400 to 600 mg 3 times daily with food. She is not interested in Toradol here in the office. I do not think imaging is needed  today. Referral to PT placed.  -     Ambulatory referral to Physical Therapy  Upper back pain on right side  OTC IcyHot Aspercreme with lidocaine may help. We discussed some side effects of muscle relaxants, recommend taking Zanaflex 4 mg at bedtime. PT will be arranged.  -     Ambulatory referral to Physical Therapy -     tiZANidine (ZANAFLEX) 4 MG tablet; Take 1 tablet (4 mg total) by mouth at bedtime.  Anxiety disorder, unspecified type  We discussed pharmacologic treatment options, she is not interested in SSRI or SNRI medications. BuSpar 10 mg twice daily started today. She will monitor for worsening symptoms. Psychotherapy  will also help. Follow-up in 6 weeks, before if needed.  -     busPIRone (BUSPAR) 10 MG tablet; Take 1 tablet (10 mg total) by mouth 2 (two) times daily.    Return in about 6 weeks (around 09/26/2017) for anxiety,back pain.      Betty G. [place], MD  [facility]. [place] office.

## 2017-08-15 NOTE — Patient Instructions (Addendum)
A few things to remember from today's visit:   Neck pain - Plan: Ambulatory referral to Physical Therapy  Upper back pain on right side - Plan: Ambulatory referral to Physical Therapy, tiZANidine (ZANAFLEX) 4 MG tablet  Anxiety disorder, unspecified type - Plan: busPIRone (BUSPAR) 10 MG tablet  Take muscle relaxant at bedtime. Local IcyHot with lidocaine or Aspercreme with lidocaine my help. Continue over-the-counter Motrin 400 to 600 mg 3 times per day with food.   Please be sure medication list is accurate. If a new problem present, please set up appointment sooner than planned today.

## 2017-08-24 ENCOUNTER — Ambulatory Visit: Payer: 59 | Attending: Family Medicine | Admitting: Physical Therapy

## 2017-09-05 ENCOUNTER — Emergency Department (HOSPITAL_COMMUNITY)
Admission: EM | Admit: 2017-09-05 | Discharge: 2017-09-05 | Disposition: A | Discharge status: Home / Self Care | Payer: 59 | Attending: Emergency Medicine | Admitting: Emergency Medicine

## 2017-09-05 ENCOUNTER — Ambulatory Visit: Payer: 59 | Admitting: Family Medicine

## 2017-09-05 ENCOUNTER — Other Ambulatory Visit: Payer: Self-pay

## 2017-09-05 ENCOUNTER — Encounter (HOSPITAL_COMMUNITY): Payer: Self-pay | Admitting: *Deleted

## 2017-09-05 DIAGNOSIS — M79604 Pain in right leg: Secondary | ICD-10-CM | POA: Diagnosis not present

## 2017-09-05 DIAGNOSIS — M542 Cervicalgia: Secondary | ICD-10-CM | POA: Diagnosis not present

## 2017-09-05 DIAGNOSIS — Z79899 Other long term (current) drug therapy: Secondary | ICD-10-CM | POA: Diagnosis not present

## 2017-09-05 DIAGNOSIS — Z041 Encounter for examination and observation following transport accident: Secondary | ICD-10-CM | POA: Diagnosis not present

## 2017-09-05 DIAGNOSIS — R51 Headache: Secondary | ICD-10-CM | POA: Diagnosis not present
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
## 2017-09-05 NOTE — ED Notes (Signed)
ED Provider at bedside. 
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
## 2017-09-05 NOTE — Discharge Instructions (Signed)
Your physical exam looks good. There is no need for head imaging or other x-rays today. You may use Tylenol and/or Ibuprofen for muscle pain you may have. Warm or cold compresses may help as well. Please follow-up with your PCP as planned.

## 2017-11-13 MED FILL — tiZANidine HCL 4 MG TABS: 4 | 30 days supply | Qty: 30 | Fill #1

## 2017-11-13 MED FILL — busPIRone HCL 10 MG TABS: 10 | 30 days supply | Qty: 60 | Fill #1

## 2018-02-15 ENCOUNTER — Ambulatory Visit: Payer: 59 | Admitting: Family Medicine

## 2018-02-15 DIAGNOSIS — Z0289 Encounter for other administrative examinations: Secondary | ICD-10-CM

## 2018-04-01 ENCOUNTER — Encounter: Payer: Self-pay | Admitting: Family Medicine

## 2018-04-01 ENCOUNTER — Ambulatory Visit: Payer: 59 | Admitting: Family Medicine

## 2018-04-01 VITALS — BP 120/70 | HR 85 | Temp 98.9°F | Resp 12 | Ht 61.0 in | Wt 138.4 lb

## 2018-04-01 DIAGNOSIS — R05 Cough: Secondary | ICD-10-CM

## 2018-04-01 DIAGNOSIS — R52 Pain, unspecified: Secondary | ICD-10-CM | POA: Diagnosis not present

## 2018-04-01 DIAGNOSIS — J069 Acute upper respiratory infection, unspecified: Secondary | ICD-10-CM

## 2018-04-01 DIAGNOSIS — R059 Cough, unspecified: Secondary | ICD-10-CM

## 2018-04-01 LAB — POCT INFLUENZA A/B
INFLUENZA A, POC: NEGATIVE
Influenza B, POC: NEGATIVE

## 2018-04-01 NOTE — Progress Notes (Signed)
ACUTE VISIT  HPI:  Chief Complaint  Patient presents with  . Nasal Congestion    sx started Saturday  . Cough    with mucus  . Generalized Body Aches  . Night Sweats    [name] is a 38 y.o.female here today complaining of 2 days of respiratory symptoms. Symptoms started with sore throat, negative for dysphasia or stridor. Subjective fever and chills.  Productive cough with yellowish sputum, denies hemoptysis. Negative for dyspnea or wheezing.   URI   This is a new problem. The current episode started in the past 7 days. The problem has been unchanged. Associated symptoms include congestion, coughing, rhinorrhea and a sore throat. Pertinent negatives include no abdominal pain, diarrhea, ear pain, headaches, nausea, neck pain, rash, sinus pain, vomiting or wheezing. Treatments tried: Tamiflu. The treatment provided mild relief.    No Hx of recent travel. "Flu outbreak" at work. No known insect bite.  Hx of allergies: Negative.  OTC medications for this problem: TheraFlu    Review of Systems  Constitutional: Positive for chills, fatigue and fever. Negative for activity change and appetite change.  HENT: Positive for congestion, postnasal drip, rhinorrhea and sore throat. Negative for ear pain, mouth sores, sinus pressure, sinus pain, trouble swallowing and voice change.   Eyes: Negative for discharge and redness.  Respiratory: Positive for cough. Negative for shortness of breath and wheezing.   Gastrointestinal: Negative for abdominal pain, diarrhea, nausea and vomiting.  Musculoskeletal: Positive for myalgias. Negative for back pain, joint swelling and neck pain.  Skin: Negative for rash.  Allergic/Immunologic: Negative for environmental allergies.  Neurological: Negative for weakness and headaches.  Hematological: Negative for adenopathy. Does not bruise/bleed easily.    Current Outpatient Medications on File Prior to Visit  Medication Sig  Dispense Refill  . levonorgestrel (MIRENA) 20 MCG/24HR IUD 1 each by Intrauterine route once.     No current facility-administered medications on file prior to visit.      Past Medical History:  Diagnosis Date  . Acne   . Anemia    hx/o iron deficiency anemia  . Essential tremor    Dr. Terrace Arabia, neurology  . History of abnormal Pap smear   . History of trichomonal vaginitis   . History of UTI   . Menorrhagia    prior trials of Depo Provera, Ortho Tri Cyclen Lo  . Yeast vaginitis    recurrent   No Known Allergies  Social History   Socioeconomic History  . Marital status: Single    Spouse name: Not on file  . Number of children: 2  . Years of education: Not on file  . Highest education level: Bachelor's degree (e.g., BA, AB, BS)  Occupational History  . Not on file  Social Needs  . Financial resource strain: Not on file  . Food insecurity:    Worry: Not on file    Inability: Not on file  . Transportation needs:    Medical: Not on file    Non-medical: Not on file  Tobacco Use  . Smoking status: Never Smoker  . Smokeless tobacco: Never Used  Substance and Sexual Activity  . Alcohol use: Not on file    Comment: occ  . Drug use: No  . Sexual activity: Yes    Birth control/protection: I.U.D.  Lifestyle  . Physical activity:    Days per week: Not on file    Minutes per session: Not on file  . Stress: Not  on file  Relationships  . Social connections:    Talks on phone: Not on file    Gets together: Not on file    Attends religious service: Not on file    Active member of club or organization: Not on file    Attends meetings of clubs or organizations: Not on file    Relationship status: Not on file  Other Topics Concern  . 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   Physical Exam  Nursing note and vitals reviewed. Constitutional: She is oriented to person, place, and time. She appears well-developed and well-nourished. She does not appear ill. No distress.  HENT:  Head: Normocephalic and atraumatic.  Nose: Rhinorrhea present.  Mouth/Throat: Oropharynx is clear and moist and mucous membranes are normal.  Hypertrophic turbinates. Postnasal drainage.  Eyes: Conjunctivae are normal.  Cardiovascular: Normal rate and regular rhythm.  No murmur heard. Respiratory: Effort normal and breath sounds normal. No respiratory distress.  Lymphadenopathy:       Head (right side): No submandibular adenopathy present.       Head (left side): No submandibular adenopathy present.    She has cervical adenopathy.       Right cervical: Posterior cervical adenopathy present.       Left cervical: Posterior cervical adenopathy present.  Neurological: She is alert and oriented to person, place, and time. She has normal strength. Gait normal.  Skin: Skin is warm. No rash noted. No erythema.  Psychiatric: She has a normal mood and affect.  Well groomed, good eye contact.    ASSESSMENT AND PLAN:   [name] was seen today for nasal congestion, cough, generalized body aches and night sweats.  Diagnoses and all orders for this visit:  Cough -     POC Influenza A/B  Generalized body aches -     POC Influenza A/B  URI, acute -     POC Influenza A/B   Rapid flu test negative. Symptoms suggests a viral etiology, symptomatic treatment recommended. Because she thinks she has been exposed to influenza, we discussed risks versus benefits of taking Tamiflu.  She is not interested in taking medication.  Instructed to monitor for signs of complications,clearly instructed about warning signs. I also explained that cough and nasal congestion can last a few days and sometimes weeks. F/U as needed.    Return if  symptoms worsen or fail to improve.      Betty G. Swazil[place]Jordan, MD  [facility]. [place] office.

## 2018-06-21 ENCOUNTER — Ambulatory Visit: Payer: 59 | Admitting: Family Medicine

## 2018-07-25 ENCOUNTER — Other Ambulatory Visit: Payer: Self-pay | Admitting: Family Medicine

## 2018-07-25 DIAGNOSIS — F419 Anxiety disorder, unspecified: Secondary | ICD-10-CM

## 2018-07-29 ENCOUNTER — Other Ambulatory Visit: Payer: Self-pay | Admitting: Family Medicine

## 2018-07-29 DIAGNOSIS — F419 Anxiety disorder, unspecified: Secondary | ICD-10-CM

## 2018-09-18 ENCOUNTER — Telehealth: Payer: Self-pay | Admitting: Family Medicine

## 2018-09-18 NOTE — Telephone Encounter (Signed)
Please advise. Does patient just need a lab appointment for this or would you prefer she have an appointment with you?

## 2018-09-18 NOTE — Telephone Encounter (Signed)
Patient needs orders and appt for TDAP and Quantiferon Gold test.  Call patient back on cell phone.  Pt needs asap.

## 2018-09-24 NOTE — Telephone Encounter (Signed)
It is okay for her to get Tdap and QuantiFERON gold test done before her appointment if needed for employment. Thanks, BJ

## 2018-09-25 NOTE — Telephone Encounter (Signed)
Left detailed message for patient informing her that if needed she could call the office and have the TDAP and PPD done before her appointment.

## 2018-10-01 ENCOUNTER — Telehealth: Payer: Self-pay | Admitting: Family Medicine

## 2018-10-01 NOTE — Telephone Encounter (Signed)
Copied from [place] [id number]. Topic: Appointment Scheduling - Scheduling Inquiry for Clinic >> Oct 01, 2018 10:15 AM Erick Blinks wrote: Reason for CRM: Pt called requesting to schedule appt for TDAP and Quantiferon Gold Test. Tried calling, please advise Best contact: [phone]

## 2018-10-02 ENCOUNTER — Other Ambulatory Visit: Payer: Self-pay

## 2018-10-02 ENCOUNTER — Ambulatory Visit (INDEPENDENT_AMBULATORY_CARE_PROVIDER_SITE_OTHER): Payer: 59 | Admitting: Family Medicine

## 2018-10-02 ENCOUNTER — Encounter: Payer: Self-pay | Admitting: Family Medicine

## 2018-10-02 DIAGNOSIS — F419 Anxiety disorder, unspecified: Secondary | ICD-10-CM

## 2018-10-02 DIAGNOSIS — Z111 Encounter for screening for respiratory tuberculosis: Secondary | ICD-10-CM

## 2018-10-02 MED ORDER — BUSPIRONE HCL 10 MG PO TABS: 10.0000 mg | ORAL_TABLET | Freq: Three times a day (TID) | ORAL | 2 refills | Status: DC

## 2018-10-02 MED FILL — busPIRone HCL 10 MG TABS: 10 | 20 days supply | Qty: 60 | Fill #0

## 2018-10-02 NOTE — Telephone Encounter (Signed)
Patient scheduled for lab and vaccine.

## 2018-10-02 NOTE — Progress Notes (Signed)
Virtual Visit via Video Note   I connected with Carolyn Pierce on 18 2020@ [email or web address]@ by a video enabled telemedicine application and verified that I am speaking with the correct person using two identifiers.  Location patient: home Location provider:work office Persons participating in the virtual visit: patient, provider  I discussed the limitations of evaluation and management by telemedicine and the availability of in person appointments. The patient expressed understanding and agreed to proceed.   HPI:  Carolyn Pierce is a 38 year old female with history of essential tremor, anxiety who is requesting a refill on BuSpar. She has taken BuSpar in the past for anxiety, which she thinks is "situational." She denies depression like symptoms. Negative for suicidal thoughts. She states that she has always had anxiety. Anxiety is exacerbated by attending social events or when she is being watched or supervised.  In the past she took propranolol, which was also prescribed for tremor. She acknowledges she has not been compliant with medication but she would like to try again. She does not like SSRIs because she thinks this type of medications are just for depression, which she does not think she has. She denies fever, chills, headaches, decreased appetite, chest pain, cough, dyspnea, palpitations, worsening tremor, changes in bowel habits, nausea, vomiting, or syncope.  She is planning going back to school, she needs vaccination updated and needs to have a TB screening test.   ROS: See pertinent positives and negatives per HPI.  Past Medical History:  Diagnosis Date  . Acne   . Anemia    hx/o iron deficiency anemia  . Essential tremor    Dr. Krista Blue, neurology  . History of abnormal Pap smear   . History of trichomonal vaginitis   . History of UTI   . Menorrhagia    prior trials of Depo Provera, Ortho Tri Cyclen Lo  . Yeast vaginitis    recurrent    Past Surgical History:  Procedure  Laterality Date  . APPENDECTOMY    . DILATION AND EVACUATION    . LAPAROSCOPIC UNILATERAL SALPINGO OOPHERECTOMY    . LAPAROSCOPY N/A 10/04/2013   Procedure: LAPAROSCOPY OPERATIVE, Removal of Right Fallopian Tube and Ovary;  Surgeon: Mora Bellman, MD;  Location: [facility];  Service: Gynecology;  Laterality: N/A;  . WISDOM TOOTH EXTRACTION      Family History  Problem Relation Age of Onset  . Stroke Mother   . Alcohol abuse Mother   . Diabetes Mother   . Drug abuse Mother   . Hyperlipidemia Mother   . Hypertension Mother   . Alcohol abuse Father   . Drug abuse Father   . Hyperlipidemia Father   . Hypertension Father   . Cancer Son   . Stroke Paternal Uncle   . Early death Maternal Grandmother   . Heart attack Maternal Grandmother     Social History   Socioeconomic History  . Marital status: Single    Spouse name: Not on file  . Number of children: 2  . Years of education: Not on file  . Highest education level: Bachelor's degree (e.g., BA, AB, BS)  Occupational History  . Not on file  Social Needs  . Financial resource strain: Not on file  . Food insecurity    Worry: Not on file    Inability: Not on file  . Transportation needs    Medical: Not on file    Non-medical: Not on file  Tobacco Use  . Smoking status: Never Smoker  . Smokeless tobacco: Never  Used  Substance and Sexual Activity  . Alcohol use: Not on file    Comment: occ  . Drug use: No  . Sexual activity: Yes    Birth control/protection: I.U.D.  Lifestyle  . Physical activity    Days per week: Not on file    Minutes per session: Not on file  . Stress: Not on file  Relationships  . Social Musicianconnections    Talks on phone: Not on file    Gets together: Not on file    Attends religious service: Not on file    Active member of club or organization: Not on file    Attends meetings of clubs or organizations: Not on file    Relationship status: Not on file  . Intimate partner violence    Fear of current  or ex partner: Not on file    Emotionally abused: Not on file    Physically abused: Not on file    Forced sexual activity: Not on file  Other Topics Concern  . 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  busPIRone (BUSPAR) 10 MG tablet, Take 1 tablet (10 mg total) by mouth 3 (three) times daily., Disp: 60 tablet, Rfl: 2 .  levonorgestrel (MIRENA) 20 MCG/24HR IUD, 1 each by Intrauterine route once., Disp: , Rfl:   EXAM:  VITALS per patient if applicable:N/A  GENERAL: alert, oriented, appears well and in no acute distress  HEENT: atraumatic, conjunctiva clear, no obvious facial abnormalities on inspection.  LUNGS: on inspection no signs of respiratory distress, breathing rate appears normal, no obvious gross SOB, gasping or wheezing  CV: no obvious cyanosis  Carolyn: moves all visible extremities without noticeable abnormality  PSYCH/NEURO: pleasant and cooperative, no obvious depression, + anxious.  Speech and thought processing grossly intact  ASSESSMENT AND PLAN:  Discussed the following assessment and plan:  Anxiety disorder, unspecified type - Plan: busPIRone (BUSPAR) 10 MG tablet We discussed treatment options, including SSRIs, she is not interested in any of these medications. BuSpar has helped her in the past, so she will resume BuSpar 10 mg twice daily. Instructed about warning signs. I will see her back in 3 months, before if needed.  Screening-pulmonary TB - Plan: QuantiFERON-TB Gold Plus She was given an appointment for tomorrow to have Tdap and blood work.    I discussed the assessment and treatment plan with the patient. The patient was provided an opportunity to ask questions and all were answered. She agreed with the plan and demonstrated an understanding of the instructions.    Return in about 3 months (around  01/02/2019).    Betty Swaz[place]Jordan, MD

## 2018-10-03 ENCOUNTER — Other Ambulatory Visit (INDEPENDENT_AMBULATORY_CARE_PROVIDER_SITE_OTHER): Payer: 59

## 2018-10-03 DIAGNOSIS — Z111 Encounter for screening for respiratory tuberculosis: Secondary | ICD-10-CM

## 2018-10-03 DIAGNOSIS — Z23 Encounter for immunization: Secondary | ICD-10-CM | POA: Diagnosis not present

## 2018-10-03 MED ORDER — TETANUS-DIPHTH-ACELL PERTUSSIS 5-2.5-18.5 LF-MCG/0.5 IM SUSP
0.5000 mL | Freq: Once | INTRAMUSCULAR | Status: AC
  Administered 2018-10-03: 0.5 mL via INTRAMUSCULAR

## 2018-10-03 NOTE — Progress Notes (Signed)
Tdap vaccine was admisterd by me, Denna Haggard per Dr. Inez Catalina Jordan's order.

## 2018-10-05 LAB — QUANTIFERON-TB GOLD PLUS
Mitogen-NIL: 6.33 IU/mL
NIL: 0.02 IU/mL
QuantiFERON-TB Gold Plus: NEGATIVE
TB1-NIL: 0 IU/mL
TB2-NIL: 0 IU/mL

## 2018-10-07 ENCOUNTER — Encounter: Payer: Self-pay | Admitting: Family Medicine

## 2018-10-31 ENCOUNTER — Other Ambulatory Visit: Payer: Self-pay

## 2018-12-04 MED FILL — busPIRone HCL 10 MG TABS: 10 | 20 days supply | Qty: 60 | Fill #1

## 2019-01-10 ENCOUNTER — Telehealth (INDEPENDENT_AMBULATORY_CARE_PROVIDER_SITE_OTHER): Payer: 59 | Admitting: Family Medicine

## 2019-01-10 ENCOUNTER — Encounter: Payer: Self-pay | Admitting: Family Medicine

## 2019-01-10 ENCOUNTER — Other Ambulatory Visit: Payer: Self-pay

## 2019-01-10 DIAGNOSIS — F419 Anxiety disorder, unspecified: Secondary | ICD-10-CM | POA: Diagnosis not present

## 2019-01-10 MED ORDER — BUSPIRONE HCL 10 MG PO TABS: ORAL_TABLET | ORAL | 1 refills | Status: DC

## 2019-01-10 MED FILL — busPIRone HCL 10 MG TABS: 10 | 90 days supply | Qty: 180 | Fill #0

## 2019-01-10 NOTE — Progress Notes (Signed)
Virtual Visit via Video Note   I connected with Carolyn Pierce on 01/10/19 by a video enabled telemedicine application and verified that I am speaking with the correct person using two identifiers.  Location patient: home Location provider:work office Persons participating in the virtual visit: patient, provider  I discussed the limitations of evaluation and management by telemedicine and the availability of in person appointments. The patient expressed understanding and agreed to proceed.   HPI: Carolyn Pierce is a 38 yo female following on anxiety. She was last seen on 10/02/18 due to worsening anxiety,BusPar 10 mg tid was started. She decreased dose because medication was causing drowsiness. She is taking Buspar 15 mg at bedtime,which has helped. Still having anxiety during the day but not as much as she did before.  She denies depression or suicidal thoughts.  She denies headache, chest pain,palpitations,SOB,abdominal pain,N/V,and diarrhea.  Problem is exacerbated by social interaction. She has not tried [employer] and she is not interested in trying.  ROS: See pertinent positives and negatives per HPI.  Past Medical History:  Diagnosis Date  . Acne   . Anemia    hx/o iron deficiency anemia  . Essential tremor    Dr. Krista Blue, neurology  . History of abnormal Pap smear   . History of trichomonal vaginitis   . History of UTI   . Menorrhagia    prior trials of Depo Provera, Ortho Tri Cyclen Lo  . Yeast vaginitis    recurrent    Past Surgical History:  Procedure Laterality Date  . APPENDECTOMY    . DILATION AND EVACUATION    . LAPAROSCOPIC UNILATERAL SALPINGO OOPHERECTOMY    . LAPAROSCOPY N/A 10/04/2013   Procedure: LAPAROSCOPY OPERATIVE, Removal of Right Fallopian Tube and Ovary;  Surgeon: Mora Bellman, MD;  Location: [facility];  Service: Gynecology;  Laterality: N/A;  . WISDOM TOOTH EXTRACTION      Family History  Problem Relation Age of Onset  . Stroke Mother   . Alcohol abuse  Mother   . Diabetes Mother   . Drug abuse Mother   . Hyperlipidemia Mother   . Hypertension Mother   . Alcohol abuse Father   . Drug abuse Father   . Hyperlipidemia Father   . Hypertension Father   . Cancer Son   . Stroke Paternal Uncle   . Early death Maternal Grandmother   . Heart attack Maternal Grandmother     Social History   Socioeconomic History  . Marital status: Single    Spouse name: Not on file  . Number of children: 2  . Years of education: Not on file  . Highest education level: Bachelor's degree (e.g., BA, AB, BS)  Occupational History  . Not on file  Social Needs  . Financial resource strain: Not on file  . Food insecurity    Worry: Not on file    Inability: Not on file  . Transportation needs    Medical: Not on file    Non-medical: Not on file  Tobacco Use  . Smoking status: Never Smoker  . Smokeless tobacco: Never Used  Substance and Sexual Activity  . Alcohol use: Not on file    Comment: occ  . Drug use: No  . Sexual activity: Yes    Birth control/protection: I.U.D.  Lifestyle  . Physical activity    Days per week: Not on file    Minutes per session: Not on file  . Stress: Not on file  Relationships  . Social connections  Talks on phone: Not on file    Gets together: Not on file    Attends religious service: Not on file    Active member of club or organization: Not on file    Attends meetings of clubs or organizations: Not on file    Relationship status: Not on file  . Intimate partner violence    Fear of current or ex partner: Not on file    Emotionally abused: Not on file    Physically abused: Not on file    Forced sexual activity: Not on file  Other Topics Concern  . Not on file  Social History Narrative   Single, son 61 years old, psychiatric nurse, exercises 3-4 days per week, 90 minutes with running, weights    Current Outpatient Medications:  .  busPIRone (BUSPAR) 10 MG tablet, 0.5 tab am and 1.5 tab at bedtime., Disp: 180  tablet, Rfl: 1 .  levonorgestrel (MIRENA) 20 MCG/24HR IUD, 1 each by Intrauterine route once., Disp: , Rfl:   EXAM:  VITALS per patient if applicable:Pulse 100   Resp 12 Comment: approx  Ht 5\' 1"  (1.549 m)   LMP 12/27/2018 (LMP Unknown)   BMI 26.15 kg/m   GENERAL: alert, oriented, appears well and in no acute distress  HEENT: atraumatic, normocephalic,conjunctiva clear, no obvious abnormalities on inspection.   LUNGS: on inspection no signs of respiratory distress, breathing rate appears normal, no obvious gross SOB, gasping or wheezing  CV: no obvious cyanosis  PSYCH/NEURO: pleasant and cooperative, no obvious depression or anxiety, speech and thought processing grossly intact  ASSESSMENT AND PLAN:  Discussed the following assessment and plan:  Anxiety disorder, unspecified type - Plan: busPIRone (BUSPAR) 10 MG tablet  Problem improved but not resolve. She agrees with trying 1/2 tablet of buspirone in the morning and continue 1.5 tablet at bedtime. Instructed about warning signs. Follow-up in 6 months, before if needed.    I discussed the assessment and treatment plan with the patient. She was provided an opportunity to ask questions and all were answered. She agreed with the plan and demonstrated an understanding of the instructions.   The patient was advised to call back or seek an in-person evaluation if the symptoms worsen or if the condition fails to improve as anticipated.  Return in about 6 months (around 07/10/2019) for cpe.    Betty 07/12/2019, MD

## 2019-02-06 NOTE — Progress Notes (Signed)
Patient is scheduled for 07/11/2019 at 8 AM

## 2019-06-09 MED FILL — MELOXICAM 7.5 MG TABLET: 7.5 | 30 days supply | Qty: 30 | Fill #0

## 2019-06-09 MED FILL — ONDANSETRON ODT 8 MG TABLET: 8 | 2 days supply | Qty: 10 | Fill #0

## 2019-06-09 MED FILL — tiZANidine HCL 4 MG TABS: 4 | 8 days supply | Qty: 30 | Fill #0

## 2019-06-09 MED FILL — SULFAMETHOXAZOLE-TMP DS TAB: 800-160 | 5 days supply | Qty: 10 | Fill #0

## 2019-06-09 MED FILL — SCOPOLAMINE 1 MG/3DAYS PT72: 1 | 3 days supply | Qty: 1 | Fill #0

## 2019-06-09 MED FILL — oxyCODONE HCL 5 MG TABS: 5 | 5 days supply | Qty: 30 | Fill #0

## 2019-07-09 MED FILL — FLUCONAZOLE 150 MG TABLET: 150 | 2 days supply | Qty: 2 | Fill #0

## 2019-07-11 ENCOUNTER — Encounter: Payer: 59 | Admitting: Family Medicine

## 2019-07-29 ENCOUNTER — Ambulatory Visit (INDEPENDENT_AMBULATORY_CARE_PROVIDER_SITE_OTHER): Payer: 59

## 2019-07-29 ENCOUNTER — Other Ambulatory Visit: Payer: Self-pay

## 2019-07-29 ENCOUNTER — Encounter (HOSPITAL_COMMUNITY): Payer: Self-pay | Admitting: Emergency Medicine

## 2019-07-29 ENCOUNTER — Ambulatory Visit (HOSPITAL_COMMUNITY)
Admission: EM | Admit: 2019-07-29 | Discharge: 2019-07-29 | Disposition: A | Discharge status: Home / Self Care | Payer: 59 | Attending: Emergency Medicine | Admitting: Emergency Medicine

## 2019-07-29 DIAGNOSIS — Z20822 Contact with and (suspected) exposure to covid-19: Secondary | ICD-10-CM | POA: Insufficient documentation

## 2019-07-29 DIAGNOSIS — R0602 Shortness of breath: Secondary | ICD-10-CM

## 2019-07-29 DIAGNOSIS — R05 Cough: Secondary | ICD-10-CM | POA: Diagnosis not present

## 2019-07-29 DIAGNOSIS — J22 Unspecified acute lower respiratory infection: Secondary | ICD-10-CM | POA: Insufficient documentation

## 2019-07-29 LAB — SARS CORONAVIRUS 2 (TAT 6-24 HRS): SARS Coronavirus 2: NEGATIVE

## 2019-07-29 MED ORDER — BENZONATATE 200 MG PO CAPS: 200.0000 mg | ORAL_CAPSULE | Freq: Three times a day (TID) | ORAL | 0 refills | Status: AC | PRN

## 2019-07-29 MED ORDER — DOXYCYCLINE HYCLATE 100 MG PO CAPS: 100.0000 mg | ORAL_CAPSULE | Freq: Two times a day (BID) | ORAL | 0 refills | Status: AC

## 2019-07-29 MED ORDER — HYDROCODONE-HOMATROPINE 5-1.5 MG/5ML PO SYRP: 5.0000 mL | ORAL_SOLUTION | Freq: Every evening | ORAL | 0 refills | Status: DC | PRN

## 2019-07-29 MED ORDER — PREDNISONE 20 MG PO TABS
40.0000 mg | ORAL_TABLET | Freq: Every day | ORAL | 0 refills | Status: AC
Start: 2019-07-29 — End: 2019-08-03

## 2019-07-29 NOTE — ED Triage Notes (Signed)
cough for a week, last 2 days coughing up mucous and having difficulty sleeping and sob  5/15 had a tummy tuck and breast augmentation

## 2019-07-29 NOTE — ED Notes (Signed)
covid sample obtained, labeled and placed in lab 

## 2019-07-29 NOTE — Discharge Instructions (Signed)
No pneumonia seen on xray- I will call if radiologist reads differently Begin doxycycline twice daily x 10 days Prednisone 40 mg daily for 5 days Tessalon/benzonatate every 8 hours as needed for cough during the day Hycodan for nighttime cough May continue to use Mucinex DM twice daily  Please continue to monitor your cough and breathing, please follow-up if any symptoms not improving or worsening despite use of the above

## 2019-07-29 NOTE — ED Provider Notes (Signed)
[place]    CSN: [id number] Arrival date & time: 07/29/19  7628      History   Chief Complaint Chief Complaint  Patient presents with   Cough    HPI Carolyn Pierce is a 39 y.o. female presenting today for evaluation of a cough.  Patient has had a cough for approximately 1 week.  In the past 2 days she has had an increasingly productive cough with mucus.  She has had poor sleep related to shortness of breath and feeling mucus in throat.  She did have surgery approximately 1 month ago and had, talc/breast augmentation.  She denies fevers.  Symptoms are mainly done chest, denies significant rhinorrhea sinus pressure ear pain.  Denies sore throat.  Denies history of asthma, denies smoking history.  Has used Mucinex without relief.  HPI  Past Medical History:  Diagnosis Date   Acne    Anemia    hx/o iron deficiency anemia   Essential tremor    Dr. Krista Blue, neurology   History of abnormal Pap smear    History of trichomonal vaginitis    History of UTI    Menorrhagia    prior trials of Depo Provera, Ortho Tri Cyclen Lo   Yeast vaginitis    recurrent    Patient Active Problem List   Diagnosis Date Noted   Cervical radiculopathy 10/29/2014   Tremor 12/26/2012   Anxiety disorder, unspecified 12/26/2012    Past Surgical History:  Procedure Laterality Date   APPENDECTOMY     BREAST SURGERY     COSMETIC SURGERY     DILATION AND EVACUATION     LAPAROSCOPIC UNILATERAL SALPINGO OOPHERECTOMY     LAPAROSCOPY N/A 10/04/2013   Procedure: LAPAROSCOPY OPERATIVE, Removal of Right Fallopian Tube and Ovary;  Surgeon: Mora Bellman, MD;  Location: [facility];  Service: Gynecology;  Laterality: N/A;   WISDOM TOOTH EXTRACTION      OB History    Gravida  6   Para  2   Term  1   Preterm  1   AB  3   Living        SAB      TAB  3   Ectopic      Multiple      Live Births  1            Home Medications    Prior to Admission  medications   Medication Sig Start Date End Date Taking? Authorizing Provider  benzonatate (TESSALON) 200 MG capsule Take 1 capsule (200 mg total) by mouth 3 (three) times daily as needed for up to 7 days for cough. 07/29/19 08/05/19  Satia Winger C, PA-C  busPIRone (BUSPAR) 10 MG tablet 0.5 tab am and 1.5 tab at bedtime. 01/10/19   Martin[place], Betty G, MD  doxycycline (VIBRAMYCIN) 100 MG capsule Take 1 capsule (100 mg total) by mouth 2 (two) times daily for 10 days. 07/29/19 08/08/19  Montell Leopard C, PA-C  HYDROcodone-homatropine (HYCODAN) 5-1.5 MG/5ML syrup Take 5 mLs by mouth at bedtime as needed for cough. 07/29/19   Desiray Orchard C, PA-C  levonorgestrel (MIRENA) 20 MCG/24HR IUD 1 each by Intrauterine route once.    [provider]  predniSONE (DELTASONE) 20 MG tablet Take 2 tablets (40 mg total) by mouth daily with breakfast for 5 days. 07/29/19 08/03/19  Berneice Zettlemoyer, Elesa Hacker, PA-C    Family History Family History  Problem Relation Age of Onset   Stroke Mother    Alcohol  abuse Mother    Diabetes Mother    Drug abuse Mother    Hyperlipidemia Mother    Hypertension Mother    Alcohol abuse Father    Drug abuse Father    Hyperlipidemia Father    Hypertension Father    Cancer Son    Stroke Paternal Uncle    Early death Maternal Grandmother    Heart attack Maternal Grandmother     Social History Social History   Tobacco Use   Smoking status: Never Smoker   Smokeless tobacco: Never Used  Substance Use Topics   Alcohol use: Not on file    Comment: occ   Drug use: No     Allergies   Patient has no known allergies.   Review of Systems Review of Systems  Constitutional: Positive for fatigue. Negative for activity change, appetite change, chills and fever.  HENT: Positive for congestion. Negative for ear pain, rhinorrhea, sinus pressure, sore throat and trouble swallowing.   Eyes: Negative for discharge and redness.  Respiratory: Positive for cough.  Negative for chest tightness and shortness of breath.   Cardiovascular: Negative for chest pain.  Gastrointestinal: Negative for abdominal pain, diarrhea, nausea and vomiting.  Musculoskeletal: Negative for myalgias.  Skin: Negative for rash.  Neurological: Negative for dizziness, light-headedness and headaches.     Physical Exam Triage Vital Signs ED Triage Vitals  Enc Vitals Group     BP 07/29/19 0930 110/80     Pulse Rate 07/29/19 0930 85     Resp 07/29/19 0930 (!) 22     Temp 07/29/19 0930 98.8 F (37.1 C)     Temp Source 07/29/19 0930 Oral     SpO2 07/29/19 0930 100 %     Weight --      Height --      Head Circumference --      Peak Flow --      Pain Score 07/29/19 0926 6     Pain Loc --      Pain Edu? --      Excl. in GC? --    No data found.  Updated Vital Signs BP 110/80 (BP Location: Right Arm)    Pulse 85    Temp 98.8 F (37.1 C) (Oral)    Resp (!) 22    LMP 07/22/2019    SpO2 100%   Visual Acuity Right Eye Distance:   Left Eye Distance:   Bilateral Distance:    Right Eye Near:   Left Eye Near:    Bilateral Near:     Physical Exam Vitals and nursing note reviewed.  Constitutional:      Appearance: She is well-developed.     Comments: No acute distress  HENT:     Head: Normocephalic and atraumatic.     Ears:     Comments: Bilateral ears without tenderness to palpation of external auricle, tragus and mastoid, EAC's without erythema or swelling, TM's with good bony landmarks and cone of light. Non erythematous.    Nose: Nose normal.     Mouth/Throat:     Comments: Oral mucosa pink and moist, no tonsillar enlargement or exudate. Posterior pharynx patent and nonerythematous, no uvula deviation or swelling. Normal phonation. Eyes:     Conjunctiva/sclera: Conjunctivae normal.  Cardiovascular:     Rate and Rhythm: Normal rate.  Pulmonary:     Effort: Pulmonary effort is normal. No respiratory distress.     Comments: Breathing comfortably at rest, CTABL,  no wheezing, rales or other  adventitious sounds auscultated Abdominal:     General: There is no distension.  Musculoskeletal:        General: Normal range of motion.     Cervical back: Neck supple.  Skin:    General: Skin is warm and dry.  Neurological:     Mental Status: She is alert and oriented to person, place, and time.      UC Treatments / Results  Labs (all labs ordered are listed, but only abnormal results are displayed) Labs Reviewed  SARS CORONAVIRUS 2 (TAT 6-24 HRS)    EKG   Radiology DG Chest 2 View  Result Date: 07/29/2019 CLINICAL DATA:  Cough and shortness of breath EXAM: CHEST - 2 VIEW COMPARISON:  March 30, 2010 FINDINGS: There is no edema or airspace opacity. Heart size and pulmonary vascularity are normal. No adenopathy. No bone lesions. IMPRESSION: Lungs clear.  Cardiac silhouette within normal limits. Electronically Signed   By: Bretta Bang III M.D.   On: 07/29/2019 10:40    Procedures Procedures (including critical care time)  Medications Ordered in UC Medications - No data to display  Initial Impression / Assessment and Plan / UC Course  I have reviewed the triage vital signs and the nursing notes.  Pertinent labs & imaging results that were available during my care of the patient were reviewed by me and considered in my medical decision making (see chart for details).     Chest x-ray negative for pneumonia.  Treating for bronchitis with doxycycline, prednisone, cough with Tessalon and Hycodan.  Rest and fluids.  Covid PCR pending.  Discussed strict return precautions. Patient verbalized understanding and is agreeable with plan.  Final Clinical Impressions(s) / UC Diagnoses   Final diagnoses:  Lower respiratory infection (e.g., bronchitis, pneumonia, pneumonitis, pulmonitis)     Discharge Instructions     No pneumonia seen on xray- I will call if radiologist reads differently Begin doxycycline twice daily x 10 days Prednisone 40  mg daily for 5 days Tessalon/benzonatate every 8 hours as needed for cough during the day Hycodan for nighttime cough May continue to use Mucinex DM twice daily  Please continue to monitor your cough and breathing, please follow-up if any symptoms not improving or worsening despite use of the above    ED Prescriptions    Medication Sig Dispense Auth. Provider   doxycycline (VIBRAMYCIN) 100 MG capsule Take 1 capsule (100 mg total) by mouth 2 (two) times daily for 10 days. 20 capsule Sakira Dahmer C, PA-C   HYDROcodone-homatropine (HYCODAN) 5-1.5 MG/5ML syrup Take 5 mLs by mouth at bedtime as needed for cough. 75 mL Emmons Toth C, PA-C   benzonatate (TESSALON) 200 MG capsule Take 1 capsule (200 mg total) by mouth 3 (three) times daily as needed for up to 7 days for cough. 28 capsule Abbe Bula C, PA-C   predniSONE (DELTASONE) 20 MG tablet Take 2 tablets (40 mg total) by mouth daily with breakfast for 5 days. 10 tablet Elmyra Banwart, [place] C, PA-C     I have reviewed the PDMP during this encounter.   Latyra Jaye, [place] C, PA-C 07/29/19 1050

## 2019-10-09 ENCOUNTER — Telehealth: Payer: Self-pay | Admitting: Family Medicine

## 2019-10-09 DIAGNOSIS — Z111 Encounter for screening for respiratory tuberculosis: Secondary | ICD-10-CM

## 2019-10-09 NOTE — Telephone Encounter (Signed)
Pt would like to have a quantiferon Gold blood test done for school is it okay to schedule?

## 2019-10-10 NOTE — Telephone Encounter (Signed)
Called pt lmom for pt to call the office to get lab appt scheduled.

## 2019-10-10 NOTE — Telephone Encounter (Signed)
Lab order is in, okay to schedule!

## 2019-10-13 ENCOUNTER — Other Ambulatory Visit: Payer: Self-pay

## 2019-10-13 ENCOUNTER — Other Ambulatory Visit (INDEPENDENT_AMBULATORY_CARE_PROVIDER_SITE_OTHER): Payer: 59

## 2019-10-13 DIAGNOSIS — Z111 Encounter for screening for respiratory tuberculosis: Secondary | ICD-10-CM | POA: Diagnosis not present

## 2019-10-14 ENCOUNTER — Encounter: Payer: Self-pay | Admitting: Family Medicine

## 2019-10-15 LAB — QUANTIFERON-TB GOLD PLUS
Mitogen-NIL: >10 IU/mL
NIL: 0.03 IU/mL
QuantiFERON-TB Gold Plus: NEGATIVE
TB1-NIL: 0.01 IU/mL
TB2-NIL: 0.01 IU/mL

## 2020-05-14 DIAGNOSIS — H5213 Myopia, bilateral: Secondary | ICD-10-CM | POA: Diagnosis not present

## 2020-06-02 ENCOUNTER — Encounter: Payer: 59 | Admitting: Family Medicine

## 2020-06-11 ENCOUNTER — Other Ambulatory Visit (HOSPITAL_COMMUNITY): Payer: Self-pay

## 2020-06-11 ENCOUNTER — Other Ambulatory Visit (HOSPITAL_COMMUNITY)
Admission: RE | Admit: 2020-06-11 | Discharge: 2020-06-11 | Disposition: A | Discharge status: Home / Self Care | Payer: 59 | Source: Ambulatory Visit | Attending: Family Medicine | Admitting: Family Medicine

## 2020-06-11 ENCOUNTER — Encounter: Payer: Self-pay | Admitting: Family Medicine

## 2020-06-11 ENCOUNTER — Other Ambulatory Visit: Payer: Self-pay

## 2020-06-11 ENCOUNTER — Ambulatory Visit (INDEPENDENT_AMBULATORY_CARE_PROVIDER_SITE_OTHER): Payer: 59 | Admitting: Family Medicine

## 2020-06-11 VITALS — BP 110/70 | HR 107 | Temp 98.7°F | Ht 61.0 in | Wt 137.6 lb

## 2020-06-11 DIAGNOSIS — N898 Other specified noninflammatory disorders of vagina: Secondary | ICD-10-CM | POA: Diagnosis not present

## 2020-06-11 DIAGNOSIS — Z1329 Encounter for screening for other suspected endocrine disorder: Secondary | ICD-10-CM | POA: Diagnosis not present

## 2020-06-11 DIAGNOSIS — Z1159 Encounter for screening for other viral diseases: Secondary | ICD-10-CM

## 2020-06-11 DIAGNOSIS — Z130 Encounter for screening for diseases of the blood and blood-forming organs and certain disorders involving the immune mechanism: Secondary | ICD-10-CM | POA: Diagnosis not present

## 2020-06-11 DIAGNOSIS — Z13228 Encounter for screening for other metabolic disorders: Secondary | ICD-10-CM

## 2020-06-11 DIAGNOSIS — Z0000 Encounter for general adult medical examination without abnormal findings: Secondary | ICD-10-CM

## 2020-06-11 DIAGNOSIS — Z13220 Encounter for screening for lipoid disorders: Secondary | ICD-10-CM

## 2020-06-11 MED ORDER — METRONIDAZOLE 500 MG PO TABS
500.0000 mg | ORAL_TABLET | Freq: Two times a day (BID) | ORAL | 0 refills | Status: AC
Start: 2020-06-11 — End: 2020-06-18
  Filled 2020-06-11: qty 14, 7d supply, fill #0

## 2020-06-11 NOTE — Patient Instructions (Addendum)
Today you have you routine preventive visit. A few things to remember from today's visit:   Routine general medical examination at a health care facility  Encounter for HCV screening test for low risk patient - Plan: Hepatitis C antibody screen  Vaginal discharge - Plan: Urine cytology ancillary only, metroNIDAZOLE (FLAGYL) 500 MG tablet  Screening for lipoid disorders - Plan: Lipid panel  Screening for endocrine, metabolic and immunity disorder - Plan: Basic metabolic panel, Hemoglobin A1c  Please be sure medication list is accurate. If a new problem present, please set up appointment sooner than planned today.  At least 150 minutes of moderate exercise per week, daily brisk walking for 15-30 min is a good exercise option. Healthy diet low in saturated (animal) fats and sweets and consisting of fresh fruits and vegetables, lean meats such as fish and [employer] chicken and whole grains.  These are some of recommendations for screening depending of age and risk factors:  - Vaccines:  Tdap vaccine every 10 years.  Shingles vaccine recommended at age 73, could be given after 38 years of age but not sure about insurance coverage.   Pneumonia vaccines: Pneumovax at 65. Sometimes Pneumovax is giving earlier if history of smoking, lung disease,diabetes,kidney disease among some.  Screening for diabetes at age 29 and every 3 years.  Cervical cancer prevention:  Pap smear starts at 38 years of age and continues periodically until 38 years old in low risk women. Pap smear every 3 years between 19 and 31 years old. Pap smear every 3-5 years between women 30 and older if pap smear negative and HPV screening negative.   -Breast cancer: Mammogram: There is disagreement between experts about when to start screening in low risk asymptomatic female but recent recommendations are to start screening at 70 and not later than 38 years old , every 1-2 years and after 38 yo q 2 years. Screening is  recommended until 38 years old but some women can continue screening depending of healthy issues.  Colon cancer screening: Has been recently changed to 38 yo. Insurance may not cover until you are 38 years old. Screening is recommended until 38 years old.  Cholesterol disorder screening at age 46 and every 3 years.  Also recommended:  1. Dental visit- Brush and floss your teeth twice daily; visit your dentist twice a year. 2. Eye doctor- Get an eye exam at least every 2 years. 3. Helmet use- Always wear a helmet when riding a bicycle, motorcycle, rollerblading or skateboarding. 4. Safe sex- If you may be exposed to sexually transmitted infections, use a condom. 5. Seat belts- Seat belts can save your live; always wear one. 6. Smoke/Carbon Monoxide detectors- These detectors need to be installed on the appropriate level of your home. Replace batteries at least once a year. 7. Skin cancer- When out in the sun please cover up and use sunscreen 15 SPF or higher. 8. Violence- If anyone is threatening or hurting you, please tell your healthcare provider.  9. Drink alcohol in moderation- Limit alcohol intake to one drink or less per day. Never drink and drive. 10. Calcium supplementation 1000 to 1200 mg daily, ideally through your diet.  Vitamin D supplementation 800 units daily.   Bacterial Vaginosis  Bacterial vaginosis is an infection of the vagina. It happens when too many normal germs (healthy bacteria) grow in the vagina. This infection can make it easier to get other infections from sex (STIs). It is very important for pregnant women to get treated.  This infection can cause babies to be born early or at a low birth weight. What are the causes? This infection is caused by an increase in certain germs that grow in the vagina. You cannot get this infection from toilet seats, bedsheets, swimming pools, or things that touch your vagina. What increases the risk?  Having sex with a new person  or more than one person.  Having sex without protection.  Douching.  Having an intrauterine device (IUD).  Smoking.  Using drugs or drinking alcohol. These can lead you to do things that are risky.  Taking certain antibiotic medicines.  Being pregnant. What are the signs or symptoms? Some women have no symptoms. Symptoms may include:  A discharge from your vagina. It may be gray or [place]. It can be watery or foamy.  A fishy smell. This can happen after sex or during your menstrual period.  Itching in and around your vagina.  A feeling of burning or pain when you pee (urinate). How is this treated? This infection is treated with antibiotic medicines. These may be given to you as:  A pill.  A cream for your vagina.  A medicine that you put into your vagina (suppository). If the infection comes back after treatment, you may need more antibiotics. Follow these instructions at home: Medicines  Take over-the-counter and prescription medicines as told by your doctor.  Take or use your antibiotic medicine as told by your doctor. Do not stop taking or using it, even if you start to feel better. General instructions  If the person you have sex with is a woman, tell her that you have this infection. She will need to follow up with her doctor. If you have a female partner, he does not need to be treated.  Do not have sex until you finish treatment.  Drink enough fluid to keep your pee pale yellow.  Keep your vagina and butt clean. ? Wash the area with warm water each day. ? Wipe from front to back after you use the toilet.  If you are breastfeeding a baby, ask your doctor if you should keep doing so during treatment.  Keep all follow-up visits. How is this prevented? Self-care  Do not douche.  Use only warm water to wash around your vagina.  Wear underwear that is cotton or lined with cotton.  Do not wear tight pants and pantyhose, especially in the summer. Safe  sex  Use protection when you have sex. This includes: ? Use condoms. ? Use dental dams. This is a thin layer that protects the mouth during oral sex.  Limit how many people you have sex with. To prevent this infection, it is best to have sex with just one person.  Get tested for STIs. The person you have sex with should also get tested. Drugs and alcohol  Do not smoke or use any products that contain nicotine or tobacco. If you need help quitting, ask your doctor.  Do not use drugs.  Do not drink alcohol if: ? Your doctor tells you not to drink. ? You are pregnant, may be pregnant, or are planning to become pregnant.  If you drink alcohol: ? Limit how much you have to 0-1 drink a day. ? Know how much alcohol is in your drink. In the [place] one drink equals one 12 oz bottle of beer (355 mL), one 5 oz glass of wine (148 mL), or one 1 oz glass of hard liquor (44 mL). Where to find  more information  Centers for Disease Control and Prevention: FootballExhibition.com.br  American Sexual Health Association: www.ashastd.org  Office on [employer] [facility]: http://hoffman.com/ Contact a doctor if:  Your symptoms do not get better, even after you are treated.  You have more discharge or pain when you pee.  You have a fever or chills.  You have pain in your belly (abdomen) or in the area between your hips.  You have pain with sex.  You bleed from your vagina between menstrual periods. Summary  This infection can happen when too many germs (bacteria) grow in the vagina.  This infection can make it easier to get infections from sex (STIs). Treating this can lower that chance.  Get treated if you are pregnant. This infection can cause babies to be born early.  Do not stop taking or using your antibiotic medicine, even if you start to feel better. This information is not intended to replace advice given to you by your health care provider. Make sure you discuss any questions you have with your  health care provider. Document Revised: 08/07/2019 Document Reviewed: 08/07/2019 Elsevier Patient Education  2021 [employer].

## 2020-06-11 NOTE — Progress Notes (Signed)
HPI: Carolyn Pierce is a 40 y.o. female, who is here today for her routine physical.  Last CPE: 06/18/17.  Regular exercise 3 or more time per week: A month ago she started going to the gyn 2 times per week Following a healthy diet: Eats out about 3 times per week,sandwishes and hamburgers. She cooks at home some. She lives with her 47 yo son.  Chronic medical problems: Anxiety and neck pain.  Pap smear: 2019. She is established with gyn. Mirena due to be changed next year. Hx of STD's: Negative.  Immunization History  Administered Date(s) Administered  . Tdap 11/12/2015, 10/03/2018   Mammogram: N/A Colonoscopy: N/A DEXA: N/A Hep C screening: Never.  Concerns today: A week of yellowish thick vaginal discharge. + Odorous. Exacerbated by sex intercourse. She doe snot always wears condoms. She has not noted associated pelvic pain,genital lesions,or vaginal bleeding. She has had BV in the past.  Anxiety: Stable.  She is not longer on Buspar.  Review of Systems  Constitutional: Negative for appetite change, fatigue and fever.  HENT: Negative for hearing loss, mouth sores, sore throat, trouble swallowing and voice change.   Eyes: Negative for redness and visual disturbance.  Respiratory: Negative for cough, shortness of breath and wheezing.   Cardiovascular: Negative for chest pain and leg swelling.  Gastrointestinal: Negative for abdominal pain, nausea and vomiting.       No changes in bowel habits.  Endocrine: Negative for cold intolerance, heat intolerance, polydipsia, polyphagia and polyuria.  Genitourinary: Negative for decreased urine volume, dyspareunia, dysuria and hematuria.  Musculoskeletal: Negative for arthralgias, gait problem and myalgias.  Skin: Negative for color change and rash.  Allergic/Immunologic: Negative for environmental allergies.  Neurological: Negative for syncope, weakness and headaches.  Hematological: Negative for adenopathy.  Does not bruise/bleed easily.  Psychiatric/Behavioral: Negative for confusion and sleep disturbance. The patient is nervous/anxious.   All other systems reviewed and are negative.  Current Outpatient Medications on File Prior to Visit  Medication Sig Dispense Refill  . levonorgestrel (MIRENA) 20 MCG/24HR IUD 1 each by Intrauterine route once.     No current facility-administered medications on file prior to visit.   Past Medical History:  Diagnosis Date  . Acne   . Anemia    hx/o iron deficiency anemia  . Essential tremor    Dr. Terrace Arabia, neurology  . History of abnormal Pap smear   . History of trichomonal vaginitis   . History of UTI   . Menorrhagia    prior trials of Depo Provera, Ortho Tri Cyclen Lo  . Yeast vaginitis    recurrent    Past Surgical History:  Procedure Laterality Date  . APPENDECTOMY    . BREAST SURGERY    . COSMETIC SURGERY    . DILATION AND EVACUATION    . LAPAROSCOPIC UNILATERAL SALPINGO OOPHERECTOMY    . LAPAROSCOPY N/A 10/04/2013   Procedure: LAPAROSCOPY OPERATIVE, Removal of Right Fallopian Tube and Ovary;  Surgeon: Catalina Antigua, MD;  Location: [facility];  Service: Gynecology;  Laterality: N/A;  . WISDOM TOOTH EXTRACTION      No Known Allergies  Family History  Problem Relation Age of Onset  . Stroke Mother   . Alcohol abuse Mother   . Diabetes Mother   . Drug abuse Mother   . Hyperlipidemia Mother   . Hypertension Mother   . Alcohol abuse Father   . Drug abuse Father   . Hyperlipidemia Father   . Hypertension Father   .  Cancer Son   . Stroke Paternal Uncle   . Early death Maternal Grandmother   . Heart attack Maternal Grandmother     Social History   Socioeconomic History  . Marital status: Single    Spouse name: Not on file  . Number of children: 2  . Years of education: Not on file  . Highest education level: Bachelor's degree (e.g., BA, AB, BS)  Occupational History  . Not on file  Tobacco Use  . Smoking status: Never Smoker   . Smokeless tobacco: Never Used  Vaping Use  . Vaping Use: Never used  Substance and Sexual Activity  . Alcohol use: Not on file    Comment: occ  . Drug use: No  . Sexual activity: Yes    Birth control/protection: I.U.D.  Other Topics Concern  . Not on file  Social History Narrative   Single, son 884 years old, psychiatric nurse, exercises 3-4 days per week, 90 minutes with running, weights   Social Determinants of Health   Financial Resource Strain: Not on file  Food Insecurity: Not on file  Transportation Needs: Not on file  Physical Activity: Not on file  Stress: Not on file  Social Connections: Not on file   Vitals:   06/11/20 1528  BP: 110/70  Pulse: (!) 107  Temp: 98.7 F (37.1 C)  SpO2: 98%   Body mass index is 26 kg/m.  Wt Readings from Last 3 Encounters:  06/11/20 137 lb 9.6 oz (62.4 kg)  04/01/18 138 lb 6 oz (62.8 kg)  09/05/17 138 lb (62.6 kg)   Physical Exam Vitals and nursing note reviewed.  Constitutional:      General: She is not in acute distress.    Appearance: She is well-developed.  HENT:     Head: Normocephalic and atraumatic.     Right Ear: Hearing, tympanic membrane, ear canal and external ear normal.     Left Ear: Hearing, tympanic membrane, ear canal and external ear normal.     Mouth/Throat:     Mouth: Mucous membranes are moist.     Pharynx: Oropharynx is clear. Uvula midline.  Eyes:     Extraocular Movements: Extraocular movements intact.     Conjunctiva/sclera: Conjunctivae normal.     Pupils: Pupils are equal, round, and reactive to light.  Neck:     Thyroid: No thyromegaly.     Trachea: No tracheal deviation.  Cardiovascular:     Rate and Rhythm: Normal rate and regular rhythm.     Pulses:          Dorsalis pedis pulses are 2+ on the right side and 2+ on the left side.     Heart sounds: No murmur heard.     Comments: HR 96/min Pulmonary:     Effort: Pulmonary effort is normal. No respiratory distress.     Breath sounds:  Normal breath sounds.  Chest:  Breasts:     Right: No supraclavicular adenopathy.     Left: No supraclavicular adenopathy.    Abdominal:     Palpations: Abdomen is soft. There is no hepatomegaly or mass.     Tenderness: There is no abdominal tenderness.  Genitourinary:    Comments: Deferred to gyn. Musculoskeletal:     Comments: No major deformity or signs of synovitis appreciated.  Lymphadenopathy:     Cervical: No cervical adenopathy.     Upper Body:     Right upper body: No supraclavicular adenopathy.     Left upper body: No  supraclavicular adenopathy.  Skin:    General: Skin is warm.     Findings: No erythema or rash.  Neurological:     General: No focal deficit present.     Mental Status: She is alert and oriented to person, place, and time.     Cranial Nerves: No cranial nerve deficit.     Coordination: Coordination normal.     Gait: Gait normal.     Deep Tendon Reflexes:     Reflex Scores:      Bicep reflexes are 2+ on the right side and 2+ on the left side.      Patellar reflexes are 2+ on the right side and 2+ on the left side. Psychiatric:        Speech: Speech normal.     Comments: Well groomed, good eye contact.   ASSESSMENT AND PLAN:  Ms. Magda Muise was here today annual physical examination.  Orders Placed This Encounter  Procedures  . Lipid panel  . Basic metabolic panel  . Hemoglobin A1c  . Hepatitis C antibody screen   Lab Results  Component Value Date   CHOL 190 06/11/2020   HDL 49 (L) 06/11/2020   LDLCALC 124 (H) 06/11/2020   TRIG 73 06/11/2020   CHOLHDL 3.9 06/11/2020   Lab Results  Component Value Date   CREATININE 0.89 06/11/2020   BUN 15 06/11/2020   NA 137 06/11/2020   K 4.4 06/11/2020   CL 101 06/11/2020   CO2 25 06/11/2020   Lab Results  Component Value Date   HGBA1C 5.4 06/11/2020   Routine general medical examination at a health care facility We discussed the importance of regular physical activity and healthy  diet for prevention of chronic illness and/or complications. Preventive guidelines reviewed. Vaccination up to date. Continue her female preventive care with her gyn. Next CPE in a year.  Encounter for HCV screening test for low risk patient -     Hepatitis C antibody screen  Vaginal discharge We discussed possible etiologies. Hx suggest BV, so empiric treatment started today. She prefers oral Metronidazole, some side effects discussed. Pelvic exam deferred for next visit if not better. Further recommendations according to urine cytology.  -     metroNIDAZOLE (FLAGYL) 500 MG tablet; Take 1 tablet (500 mg total) by mouth 2 (two) times daily for 7 days.  Screening for lipoid disorders -     Lipid panel  Screening for endocrine, metabolic and immunity disorder -     Hemoglobin A1c -     Basic metabolic panel  Return in 1 year (on 06/11/2021) for CPE.   Shanielle Correll G. [place], MD  [facility]. [place] office.   Today you have you routine preventive visit. A few things to remember from today's visit:   Routine general medical examination at a health care facility  Encounter for HCV screening test for low risk patient - Plan: Hepatitis C antibody screen  Vaginal discharge - Plan: Urine cytology ancillary only, metroNIDAZOLE (FLAGYL) 500 MG tablet  Screening for lipoid disorders - Plan: Lipid panel  Screening for endocrine, metabolic and immunity disorder - Plan: Basic metabolic panel, Hemoglobin A1c  Please be sure medication list is accurate. If a new problem present, please set up appointment sooner than planned today.  At least 150 minutes of moderate exercise per week, daily brisk walking for 15-30 min is a good exercise option. Healthy diet low in saturated (animal) fats and sweets and consisting of fresh fruits and vegetables,  lean meats such as fish and [place] chicken and whole grains.  These are some of recommendations for screening depending of age and risk  factors:  - Vaccines:  Tdap vaccine every 10 years.  Shingles vaccine recommended at age 26, could be given after 38 years of age but not sure about insurance coverage.   Pneumonia vaccines: Pneumovax at 65. Sometimes Pneumovax is giving earlier if history of smoking, lung disease,diabetes,kidney disease among some.  Screening for diabetes at age 23 and every 3 years.  Cervical cancer prevention:  Pap smear starts at 38 years of age and continues periodically until 38 years old in low risk women. Pap smear every 3 years between 38 and 32 years old. Pap smear every 3-5 years between women 30 and older if pap smear negative and HPV screening negative.   -Breast cancer: Mammogram: There is disagreement between experts about when to start screening in low risk asymptomatic female but recent recommendations are to start screening at 22 and not later than 38 years old , every 1-2 years and after 38 yo q 2 years. Screening is recommended until 38 years old but some women can continue screening depending of healthy issues.  Colon cancer screening: Has been recently changed to 38 yo. Insurance may not cover until you are 38 years old. Screening is recommended until 38 years old.  Cholesterol disorder screening at age 61 and every 3 years.  Also recommended:  1. Dental visit- Brush and floss your teeth twice daily; visit your dentist twice a year. 2. Eye doctor- Get an eye exam at least every 2 years. 3. Helmet use- Always wear a helmet when riding a bicycle, motorcycle, rollerblading or skateboarding. 4. Safe sex- If you may be exposed to sexually transmitted infections, use a condom. 5. Seat belts- Seat belts can save your live; always wear one. 6. Smoke/Carbon Monoxide detectors- These detectors need to be installed on the appropriate level of your home. Replace batteries at least once a year. 7. Skin cancer- When out in the sun please cover up and use sunscreen 15 SPF or  higher. 8. Violence- If anyone is threatening or hurting you, please tell your healthcare provider.  9. Drink alcohol in moderation- Limit alcohol intake to one drink or less per day. Never drink and drive. 10. Calcium supplementation 1000 to 1200 mg daily, ideally through your diet.  Vitamin D supplementation 800 units daily.   Bacterial Vaginosis  Bacterial vaginosis is an infection of the vagina. It happens when too many normal germs (healthy bacteria) grow in the vagina. This infection can make it easier to get other infections from sex (STIs). It is very important for pregnant women to get treated. This infection can cause babies to be born early or at a low birth weight. What are the causes? This infection is caused by an increase in certain germs that grow in the vagina. You cannot get this infection from toilet seats, bedsheets, swimming pools, or things that touch your vagina. What increases the risk?  Having sex with a new person or more than one person.  Having sex without protection.  Douching.  Having an intrauterine device (IUD).  Smoking.  Using drugs or drinking alcohol. These can lead you to do things that are risky.  Taking certain antibiotic medicines.  Being pregnant. What are the signs or symptoms? Some women have no symptoms. Symptoms may include:  A discharge from your vagina. It may be gray or [place]. It can be watery or  foamy.  A fishy smell. This can happen after sex or during your menstrual period.  Itching in and around your vagina.  A feeling of burning or pain when you pee (urinate). How is this treated? This infection is treated with antibiotic medicines. These may be given to you as:  A pill.  A cream for your vagina.  A medicine that you put into your vagina (suppository). If the infection comes back after treatment, you may need more antibiotics. Follow these instructions at home: Medicines  Take over-the-counter and prescription  medicines as told by your doctor.  Take or use your antibiotic medicine as told by your doctor. Do not stop taking or using it, even if you start to feel better. General instructions  If the person you have sex with is a woman, tell her that you have this infection. She will need to follow up with her doctor. If you have a female partner, he does not need to be treated.  Do not have sex until you finish treatment.  Drink enough fluid to keep your pee pale yellow.  Keep your vagina and butt clean. ? Wash the area with warm water each day. ? Wipe from front to back after you use the toilet.  If you are breastfeeding a baby, ask your doctor if you should keep doing so during treatment.  Keep all follow-up visits. How is this prevented? Self-care  Do not douche.  Use only warm water to wash around your vagina.  Wear underwear that is cotton or lined with cotton.  Do not wear tight pants and pantyhose, especially in the summer. Safe sex  Use protection when you have sex. This includes: ? Use condoms. ? Use dental dams. This is a thin layer that protects the mouth during oral sex.  Limit how many people you have sex with. To prevent this infection, it is best to have sex with just one person.  Get tested for STIs. The person you have sex with should also get tested. Drugs and alcohol  Do not smoke or use any products that contain nicotine or tobacco. If you need help quitting, ask your doctor.  Do not use drugs.  Do not drink alcohol if: ? Your doctor tells you not to drink. ? You are pregnant, may be pregnant, or are planning to become pregnant.  If you drink alcohol: ? Limit how much you have to 0-1 drink a day. ? Know how much alcohol is in your drink. In the [place] one drink equals one 12 oz bottle of beer (355 mL), one 5 oz glass of wine (148 mL), or one 1 oz glass of hard liquor (44 mL). Where to find more information  Centers for Disease Control and Prevention:  FootballExhibition.com.br  American Sexual Health Association: www.ashastd.org  Office on [employer] Health: http://hoffman.com/ Contact a doctor if:  Your symptoms do not get better, even after you are treated.  You have more discharge or pain when you pee.  You have a fever or chills.  You have pain in your belly (abdomen) or in the area between your hips.  You have pain with sex.  You bleed from your vagina between menstrual periods. Summary  This infection can happen when too many germs (bacteria) grow in the vagina.  This infection can make it easier to get infections from sex (STIs). Treating this can lower that chance.  Get treated if you are pregnant. This infection can cause babies to be born early.  Do  not stop taking or using your antibiotic medicine, even if you start to feel better. This information is not intended to replace advice given to you by your health care provider. Make sure you discuss any questions you have with your health care provider. Document Revised: 08/07/2019 Document Reviewed: 08/07/2019 Elsevier Patient Education  2021 [employer].

## 2020-06-14 LAB — LIPID PANEL
Cholesterol: 190 mg/dL (ref <200)
HDL: 49 mg/dL — ABNORMAL LOW (ref >=50)
LDL Cholesterol (Calc): 124 mg/dL (calc) — ABNORMAL HIGH
Non-HDL Cholesterol (Calc): 141 mg/dL (calc) — ABNORMAL HIGH (ref <130)
Total CHOL/HDL Ratio: 3.9 (calc) (ref <5.0)
Triglycerides: 73 mg/dL (ref <150)

## 2020-06-14 LAB — BASIC METABOLIC PANEL
BUN: 15 mg/dL (ref 7–25)
CO2: 25 mmol/L (ref 20–32)
Calcium: 9.5 mg/dL (ref 8.6–10.2)
Chloride: 101 mmol/L (ref 98–110)
Creat: 0.89 mg/dL (ref 0.50–1.10)
Glucose, Bld: 86 mg/dL (ref 65–99)
Potassium: 4.4 mmol/L (ref 3.5–5.3)
Sodium: 137 mmol/L (ref 135–146)

## 2020-06-14 LAB — HEMOGLOBIN A1C
Hgb A1c MFr Bld: 5.4 % of total Hgb (ref <5.7)
Mean Plasma Glucose: 108 mg/dL
eAG (mmol/L): 6 mmol/L

## 2020-06-14 LAB — HEPATITIS C ANTIBODY
Hepatitis C Ab: NONREACTIVE
SIGNAL TO CUT-OFF: 0.01 (ref <1.00)

## 2020-06-15 LAB — URINE CYTOLOGY ANCILLARY ONLY
Bacterial Vaginitis-Urine: NEGATIVE
Chlamydia: NEGATIVE
Comment: NEGATIVE
Comment: NEGATIVE
Comment: NORMAL
Neisseria Gonorrhea: NEGATIVE
Trichomonas: NEGATIVE

## 2020-06-28 DIAGNOSIS — K644 Residual hemorrhoidal skin tags: Secondary | ICD-10-CM | POA: Diagnosis not present

## 2020-10-05 ENCOUNTER — Other Ambulatory Visit (HOSPITAL_COMMUNITY): Payer: Self-pay

## 2020-10-05 ENCOUNTER — Encounter: Payer: Self-pay | Admitting: Family Medicine

## 2020-10-05 MED ORDER — METRONIDAZOLE 500 MG PO TABS
500.0000 mg | ORAL_TABLET | Freq: Two times a day (BID) | ORAL | 0 refills | Status: DC
  Filled 2020-10-05: qty 14, 7d supply, fill #0

## 2020-10-05 MED ORDER — NITROFURANTOIN MONOHYD MACRO 100 MG PO CAPS
100.0000 mg | ORAL_CAPSULE | Freq: Two times a day (BID) | ORAL | 0 refills | Status: DC
  Filled 2020-10-05: qty 14, 7d supply, fill #0

## 2020-11-24 ENCOUNTER — Other Ambulatory Visit (HOSPITAL_COMMUNITY)
Admission: RE | Admit: 2020-11-24 | Discharge: 2020-11-24 | Disposition: A | Discharge status: Home / Self Care | Payer: 59 | Source: Ambulatory Visit | Attending: Family Medicine | Admitting: Family Medicine

## 2020-11-24 ENCOUNTER — Encounter: Payer: Self-pay | Admitting: Family Medicine

## 2020-11-24 ENCOUNTER — Other Ambulatory Visit: Payer: Self-pay

## 2020-11-24 ENCOUNTER — Ambulatory Visit: Payer: 59 | Admitting: Family Medicine

## 2020-11-24 VITALS — BP 110/70 | HR 104 | Resp 12 | Ht 61.0 in | Wt 145.0 lb

## 2020-11-24 DIAGNOSIS — Z113 Encounter for screening for infections with a predominantly sexual mode of transmission: Secondary | ICD-10-CM | POA: Diagnosis not present

## 2020-11-24 DIAGNOSIS — Z23 Encounter for immunization: Secondary | ICD-10-CM

## 2020-11-24 DIAGNOSIS — N898 Other specified noninflammatory disorders of vagina: Secondary | ICD-10-CM | POA: Insufficient documentation

## 2020-11-24 NOTE — Progress Notes (Signed)
ACUTE VISIT Chief Complaint  Patient presents with   std screening   HPI: [name] is a 40 y.o. female, who is here today requesting STD screening. She started dating again after 4-5 years, sexually active for the past 6 months. Fishy odor after she has sex intercourse without condom, she has not noted it for the past few days but recently completed treatment. She has been treated for BV with oral Metronidazole , which usually helps but problem is recurrent.  She has whitish/yellowish vaginal discharge, which she considers as her normal. LMP 3 weeks ago.  Treated for chlamydia in her teens.  Vaginal Discharge The patient's primary symptoms include a genital odor and vaginal discharge. The patient's pertinent negatives include no genital itching, genital lesions, genital rash, missed menses, pelvic pain or vaginal bleeding. The problem occurs constantly. The problem has been unchanged. The patient is experiencing no pain. She is not pregnant. Pertinent negatives include no abdominal pain, chills, discolored urine, dysuria, fever, flank pain, frequency, nausea, rash or vomiting. The vaginal discharge was malodorous, [place] and yellow. There has been no bleeding. The symptoms are aggravated by intercourse. She has tried nothing for the symptoms. She is sexually active. No, her partner does not have an STD. She uses an IUD for contraception. Her menstrual history has been regular. Her past medical history is significant for vaginosis. There is no history of PID.   Review of Systems  Constitutional:  Negative for activity change, appetite change, chills and fever.  Gastrointestinal:  Negative for abdominal pain, nausea and vomiting.  Genitourinary:  Positive for vaginal discharge. Negative for dyspareunia, dysuria, flank pain, frequency, missed menses and pelvic pain.  Musculoskeletal:  Negative for arthralgias and joint swelling.  Skin:  Negative for rash.  Rest see  pertinent positives and negatives per HPI.  Current Outpatient Medications on File Prior to Visit  Medication Sig Dispense Refill   levonorgestrel (MIRENA) 20 MCG/24HR IUD 1 each by Intrauterine route once.     No current facility-administered medications on file prior to visit.   Past Medical History:  Diagnosis Date   Acne    Anemia    hx/o iron deficiency anemia   Essential tremor    Dr. Terrace Arabia, neurology   History of abnormal Pap smear    History of trichomonal vaginitis    History of UTI    Menorrhagia    prior trials of Depo Provera, Ortho Tri Cyclen Lo   Yeast vaginitis    recurrent   No Known Allergies  Social History   Socioeconomic History   Marital status: Single    Spouse name: Not on file   Number of children: 2   Years of education: Not on file   Highest education level: Bachelor's degree (e.g., BA, AB, BS)  Occupational History   Not on file  Tobacco Use   Smoking status: Never   Smokeless tobacco: Never  Vaping Use   Vaping Use: Never used  Substance and Sexual Activity   Alcohol use: Not on file    Comment: occ   Drug use: No   Sexual activity: Yes    Birth control/protection: I.U.D.  Other Topics Concern   Not on file  Social History Narrative   Single, son 71 years old, psychiatric nurse, exercises 3-4 days per week, 90 minutes with running, weights   Social Determinants of Health   Financial Resource Strain: Not on file  Food Insecurity: Not on file  Transportation Needs: Not on  file  Physical Activity: Not on file  Stress: Not on file  Social Connections: Not on file    Vitals:   11/24/20 1532  BP: 110/70  Pulse: (!) 104  Resp: 12  SpO2: 99%   Body mass index is 27.4 kg/m.  Physical Exam Vitals and nursing note reviewed.  Constitutional:      General: She is not in acute distress.    Appearance: Normal appearance. She is well-developed.  HENT:     Head: Normocephalic and atraumatic.  Eyes:     Conjunctiva/sclera:  Conjunctivae normal.  Pulmonary:     Effort: Pulmonary effort is normal. No respiratory distress.  Abdominal:     Palpations: Abdomen is soft. There is no hepatomegaly or mass.     Tenderness: There is no abdominal tenderness.  Lymphadenopathy:     Cervical: No cervical adenopathy.  Skin:    General: Skin is warm.     Findings: No erythema or rash.  Neurological:     General: No focal deficit present.     Mental Status: She is alert and oriented to person, place, and time.     Gait: Gait normal.  Psychiatric:     Comments: Well groomed, good eye contact.   ASSESSMENT AND PLAN:  [name] was seen today for std screening.  Diagnoses and all orders for this visit:  Vaginal discharge We discussed possible etiologies. Hx of recurrent BV, we discussed Dx,risk factors,and treatment options. Further recommendations according to lab result.  Screen for STD (sexually transmitted disease) -     HIV Antibody (routine testing w rflx); Future -     RPR; Future -     RPR  Need for influenza vaccination -     Flu Vaccine QUAD 27mo+IM (Fluarix, Fluzone & Alfiuria Quad PF)  Return if symptoms worsen or fail to improve.  Borghild Thaker G. [place], MD  [facility]. [place] office.

## 2020-11-24 NOTE — Patient Instructions (Signed)
A few things to remember from today's visit:   Vaginal discharge - Plan: Urine cytology ancillary only  Screen for STD (sexually transmitted disease) - Plan: HIV Antibody (routine testing w rflx), RPR  If you need refills please call your pharmacy. Do not use My Chart to request refills or for acute issues that need immediate attention.  Bacterial Vaginosis Bacterial vaginosis is an infection of the vagina. It happens when too many normal germs (healthy bacteria) grow in the vagina. This infection can make it easier to get other infections from sex (STIs). It is very important for pregnant women to get treated. This infection can cause babies to be born early or at a low birth weight. What are the causes? This infection is caused by an increase in certain germs that grow in the vagina. You cannot get this infection from toilet seats, bedsheets, swimming pools, or things that touch your vagina. What increases the risk? Having sex with a new person or more than one person. Having sex without protection. Douching. Having an intrauterine device (IUD). Smoking. Using drugs or drinking alcohol. These can lead you to do things that are risky. Taking certain antibiotic medicines. Being pregnant. What are the signs or symptoms? Some women have no symptoms. Symptoms may include: A discharge from your vagina. It may be gray or [place]. It can be watery or foamy. A fishy smell. This can happen after sex or during your menstrual period. Itching in and around your vagina. A feeling of burning or pain when you pee (urinate). How is this treated? This infection is treated with antibiotic medicines. These may be given to you as: A pill. A cream for your vagina. A medicine that you put into your vagina (suppository). If the infection comes back after treatment, you may need more antibiotics. Follow these instructions at home: Medicines Take over-the-counter and prescription medicines as told by  your doctor. Take or use your antibiotic medicine as told by your doctor. Do not stop taking or using it, even if you start to feel better. General instructions If the person you have sex with is a woman, tell her that you have this infection. She will need to follow up with her doctor. If you have a female partner, he does not need to be treated. Do not have sex until you finish treatment. Drink enough fluid to keep your pee pale yellow. Keep your vagina and butt clean. Wash the area with warm water each day. Wipe from front to back after you use the toilet. If you are breastfeeding a baby, ask your doctor if you should keep doing so during treatment. Keep all follow-up visits. How is this prevented? Self-care Do not douche. Use only warm water to wash around your vagina. Wear underwear that is cotton or lined with cotton. Do not wear tight pants and pantyhose, especially in the summer. Safe sex Use protection when you have sex. This includes: Use condoms. Use dental dams. This is a thin layer that protects the mouth during oral sex. Limit how many people you have sex with. To prevent this infection, it is best to have sex with just one person. Get tested for STIs. The person you have sex with should also get tested. Drugs and alcohol Do not smoke or use any products that contain nicotine or tobacco. If you need help quitting, ask your doctor. Do not use drugs. Do not drink alcohol if: Your doctor tells you not to drink. You are pregnant, may be pregnant,  or are planning to become pregnant. If you drink alcohol: Limit how much you have to 0-1 drink a day. Know how much alcohol is in your drink. In the [place] one drink equals one 12 oz bottle of beer (355 mL), one 5 oz glass of wine (148 mL), or one 1 oz glass of hard liquor (44 mL). Where to find more information Centers for Disease Control and Prevention: FootballExhibition.com.br American Sexual Health Association: www.ashastd.org Office on  [employer] Health: http://hoffman.com/ Contact a doctor if: Your symptoms do not get better, even after you are treated. You have more discharge or pain when you pee. You have a fever or chills. You have pain in your belly (abdomen) or in the area between your hips. You have pain with sex. You bleed from your vagina between menstrual periods. Summary This infection can happen when too many germs (bacteria) grow in the vagina. This infection can make it easier to get infections from sex (STIs). Treating this can lower that chance. Get treated if you are pregnant. This infection can cause babies to be born early. Do not stop taking or using your antibiotic medicine, even if you start to feel better. This information is not intended to replace advice given to you by your health care provider. Make sure you discuss any questions you have with your health care provider. Document Revised: 08/07/2019 Document Reviewed: 08/07/2019 Elsevier Patient Education  2022 Elsevier Inc.   Please be sure medication list is accurate. If a new problem present, please set up appointment sooner than planned today.

## 2020-11-25 ENCOUNTER — Other Ambulatory Visit: Payer: 59

## 2020-11-25 ENCOUNTER — Encounter: Payer: Self-pay | Admitting: Family Medicine

## 2020-11-25 DIAGNOSIS — Z113 Encounter for screening for infections with a predominantly sexual mode of transmission: Secondary | ICD-10-CM

## 2020-11-26 LAB — RPR: RPR Ser Ql: NONREACTIVE

## 2020-11-26 LAB — HIV ANTIBODY (ROUTINE TESTING W REFLEX): HIV 1&2 Ab, 4th Generation: NONREACTIVE

## 2020-11-29 LAB — URINE CYTOLOGY ANCILLARY ONLY
Bacterial Vaginitis-Urine: NEGATIVE
Chlamydia: NEGATIVE
Comment: NEGATIVE
Comment: NEGATIVE
Comment: NORMAL
Neisseria Gonorrhea: NEGATIVE
Trichomonas: NEGATIVE

## 2021-05-25 DIAGNOSIS — F4322 Adjustment disorder with anxiety: Secondary | ICD-10-CM | POA: Diagnosis not present

## 2021-06-01 DIAGNOSIS — F4322 Adjustment disorder with anxiety: Secondary | ICD-10-CM | POA: Diagnosis not present

## 2021-06-08 DIAGNOSIS — F4322 Adjustment disorder with anxiety: Secondary | ICD-10-CM | POA: Diagnosis not present

## 2021-06-15 DIAGNOSIS — F4322 Adjustment disorder with anxiety: Secondary | ICD-10-CM | POA: Diagnosis not present

## 2021-06-29 DIAGNOSIS — F4322 Adjustment disorder with anxiety: Secondary | ICD-10-CM | POA: Diagnosis not present

## 2021-07-06 DIAGNOSIS — F4322 Adjustment disorder with anxiety: Secondary | ICD-10-CM | POA: Diagnosis not present

## 2021-07-20 DIAGNOSIS — F4322 Adjustment disorder with anxiety: Secondary | ICD-10-CM | POA: Diagnosis not present

## 2021-07-27 DIAGNOSIS — F4322 Adjustment disorder with anxiety: Secondary | ICD-10-CM | POA: Diagnosis not present

## 2021-08-03 DIAGNOSIS — F4322 Adjustment disorder with anxiety: Secondary | ICD-10-CM | POA: Diagnosis not present

## 2021-08-10 DIAGNOSIS — F4322 Adjustment disorder with anxiety: Secondary | ICD-10-CM | POA: Diagnosis not present

## 2021-08-11 DIAGNOSIS — F4322 Adjustment disorder with anxiety: Secondary | ICD-10-CM | POA: Diagnosis not present

## 2021-08-17 DIAGNOSIS — F4322 Adjustment disorder with anxiety: Secondary | ICD-10-CM | POA: Diagnosis not present

## 2021-08-31 DIAGNOSIS — F4322 Adjustment disorder with anxiety: Secondary | ICD-10-CM | POA: Diagnosis not present

## 2021-09-07 DIAGNOSIS — F4322 Adjustment disorder with anxiety: Secondary | ICD-10-CM | POA: Diagnosis not present

## 2021-09-14 DIAGNOSIS — F4322 Adjustment disorder with anxiety: Secondary | ICD-10-CM | POA: Diagnosis not present

## 2021-09-21 DIAGNOSIS — F4322 Adjustment disorder with anxiety: Secondary | ICD-10-CM | POA: Diagnosis not present

## 2021-10-05 DIAGNOSIS — F4322 Adjustment disorder with anxiety: Secondary | ICD-10-CM | POA: Diagnosis not present

## 2021-10-12 DIAGNOSIS — F4322 Adjustment disorder with anxiety: Secondary | ICD-10-CM | POA: Diagnosis not present

## 2021-10-19 DIAGNOSIS — F4322 Adjustment disorder with anxiety: Secondary | ICD-10-CM | POA: Diagnosis not present

## 2021-10-26 DIAGNOSIS — F4322 Adjustment disorder with anxiety: Secondary | ICD-10-CM | POA: Diagnosis not present

## 2021-11-02 DIAGNOSIS — F4322 Adjustment disorder with anxiety: Secondary | ICD-10-CM | POA: Diagnosis not present

## 2021-11-09 DIAGNOSIS — F4322 Adjustment disorder with anxiety: Secondary | ICD-10-CM | POA: Diagnosis not present

## 2021-11-16 DIAGNOSIS — F4322 Adjustment disorder with anxiety: Secondary | ICD-10-CM | POA: Diagnosis not present

## 2021-11-23 DIAGNOSIS — F4322 Adjustment disorder with anxiety: Secondary | ICD-10-CM | POA: Diagnosis not present

## 2021-11-30 DIAGNOSIS — F4322 Adjustment disorder with anxiety: Secondary | ICD-10-CM | POA: Diagnosis not present

## 2021-12-21 DIAGNOSIS — F4322 Adjustment disorder with anxiety: Secondary | ICD-10-CM | POA: Diagnosis not present

## 2021-12-28 DIAGNOSIS — F4322 Adjustment disorder with anxiety: Secondary | ICD-10-CM | POA: Diagnosis not present

## 2022-01-04 DIAGNOSIS — F4322 Adjustment disorder with anxiety: Secondary | ICD-10-CM | POA: Diagnosis not present

## 2022-01-11 DIAGNOSIS — F4322 Adjustment disorder with anxiety: Secondary | ICD-10-CM | POA: Diagnosis not present

## 2022-01-18 DIAGNOSIS — F4322 Adjustment disorder with anxiety: Secondary | ICD-10-CM | POA: Diagnosis not present

## 2022-01-25 DIAGNOSIS — F4322 Adjustment disorder with anxiety: Secondary | ICD-10-CM | POA: Diagnosis not present

## 2022-02-01 DIAGNOSIS — F4322 Adjustment disorder with anxiety: Secondary | ICD-10-CM | POA: Diagnosis not present

## 2022-02-08 DIAGNOSIS — F4322 Adjustment disorder with anxiety: Secondary | ICD-10-CM | POA: Diagnosis not present

## 2022-03-29 ENCOUNTER — Institutional Professional Consult (permissible substitution): Payer: Self-pay | Admitting: Plastic Surgery

## 2023-01-31 ENCOUNTER — Ambulatory Visit: Payer: Commercial Managed Care - PPO

## 2023-01-31 ENCOUNTER — Ambulatory Visit (INDEPENDENT_AMBULATORY_CARE_PROVIDER_SITE_OTHER): Payer: Commercial Managed Care - PPO | Admitting: Family Medicine

## 2023-01-31 ENCOUNTER — Encounter: Payer: Self-pay | Admitting: Family Medicine

## 2023-01-31 VITALS — BP 110/70 | HR 79 | Temp 98.7°F | Resp 12 | Ht 61.0 in | Wt 150.2 lb

## 2023-01-31 DIAGNOSIS — Z1329 Encounter for screening for other suspected endocrine disorder: Secondary | ICD-10-CM | POA: Diagnosis not present

## 2023-01-31 DIAGNOSIS — R0789 Other chest pain: Secondary | ICD-10-CM

## 2023-01-31 DIAGNOSIS — E785 Hyperlipidemia, unspecified: Secondary | ICD-10-CM | POA: Diagnosis not present

## 2023-01-31 DIAGNOSIS — E559 Vitamin D deficiency, unspecified: Secondary | ICD-10-CM | POA: Diagnosis not present

## 2023-01-31 DIAGNOSIS — Z0189 Encounter for other specified special examinations: Secondary | ICD-10-CM

## 2023-01-31 DIAGNOSIS — Z13228 Encounter for screening for other metabolic disorders: Secondary | ICD-10-CM

## 2023-01-31 DIAGNOSIS — Z130 Encounter for screening for diseases of the blood and blood-forming organs and certain disorders involving the immune mechanism: Secondary | ICD-10-CM | POA: Diagnosis not present

## 2023-01-31 DIAGNOSIS — D509 Iron deficiency anemia, unspecified: Secondary | ICD-10-CM | POA: Diagnosis not present

## 2023-01-31 DIAGNOSIS — Z0000 Encounter for general adult medical examination without abnormal findings: Secondary | ICD-10-CM | POA: Diagnosis not present

## 2023-01-31 DIAGNOSIS — R5383 Other fatigue: Secondary | ICD-10-CM | POA: Diagnosis not present

## 2023-01-31 DIAGNOSIS — R079 Chest pain, unspecified: Secondary | ICD-10-CM | POA: Diagnosis not present

## 2023-01-31 LAB — COMPREHENSIVE METABOLIC PANEL
ALT: 10 U/L (ref 0–35)
AST: 14 U/L (ref 0–37)
Albumin: 4.1 g/dL (ref 3.5–5.2)
Alkaline Phosphatase: 44 U/L (ref 39–117)
BUN: 12 mg/dL (ref 6–23)
CO2: 30 meq/L (ref 19–32)
Calcium: 9.1 mg/dL (ref 8.4–10.5)
Chloride: 103 meq/L (ref 96–112)
Creatinine, Ser: 0.72 mg/dL (ref 0.40–1.20)
GFR: 104.65 mL/min (ref >60.00)
Glucose, Bld: 92 mg/dL (ref 70–99)
Potassium: 4 meq/L (ref 3.5–5.1)
Sodium: 137 meq/L (ref 135–145)
Total Bilirubin: 0.7 mg/dL (ref 0.2–1.2)
Total Protein: 6.4 g/dL (ref 6.0–8.3)

## 2023-01-31 LAB — CBC
HCT: 39 % (ref 36.0–46.0)
Hemoglobin: 13.1 g/dL (ref 12.0–15.0)
MCHC: 33.7 g/dL (ref 30.0–36.0)
MCV: 93.9 fL (ref 78.0–100.0)
Platelets: 317 10*3/uL (ref 150.0–400.0)
RBC: 4.15 Mil/uL (ref 3.87–5.11)
RDW: 13.7 % (ref 11.5–15.5)
WBC: 6.3 10*3/uL (ref 4.0–10.5)

## 2023-01-31 LAB — TSH: TSH: 2.32 u[IU]/mL (ref 0.35–5.50)

## 2023-01-31 LAB — LIPID PANEL
Cholesterol: 210 mg/dL — ABNORMAL HIGH (ref 0–200)
HDL: 49.1 mg/dL (ref >39.00)
LDL Cholesterol: 147 mg/dL — ABNORMAL HIGH (ref 0–99)
NonHDL: 160.64
Total CHOL/HDL Ratio: 4
Triglycerides: 70 mg/dL (ref 0.0–149.0)
VLDL: 14 mg/dL (ref 0.0–40.0)

## 2023-01-31 LAB — FERRITIN: Ferritin: 86.8 ng/mL (ref 10.0–291.0)

## 2023-01-31 LAB — IRON: Iron: 85 ug/dL (ref 42–145)

## 2023-01-31 LAB — VITAMIN D 25 HYDROXY (VIT D DEFICIENCY, FRACTURES): VITD: 23.14 ng/mL — ABNORMAL LOW (ref 30.00–100.00)

## 2023-01-31 NOTE — Assessment & Plan Note (Signed)
We discussed the importance of regular physical activity and healthy diet for prevention of chronic illness and/or complications. Preventive guidelines reviewed. Vaccination up to date. Continue her female preventive care with her gyn, appt 02/2023. Next CPE in a year.

## 2023-01-31 NOTE — Assessment & Plan Note (Signed)
Non pharmacologic treatment recommended for now. Further recommendations will be given according to 10 years CVD risk score and lipid panel numbers. 

## 2023-01-31 NOTE — Patient Instructions (Addendum)
A few things to remember from today's visit:  Routine general medical examination at a health care facility  Screening for endocrine, metabolic and immunity disorder - Plan: Comprehensive metabolic panel  Dyslipidemia (high LDL; low HDL) - Plan: Lipid panel  Iron deficiency anemia, unspecified iron deficiency anemia type - Plan: CBC, Iron, Ferritin  Other fatigue - Plan: CBC, TSH, Comprehensive metabolic panel  Other chest pain - Plan: EKG 12-Lead, DG Chest 2 View  Patient request for diagnostic testing - Plan: VITAMIN D 25 Hydroxy (Vit-D Deficiency, Fractures)  If you need refills for medications you take chronically, please call your pharmacy. Do not use My Chart to request refills or for acute issues that need immediate attention. If you send a my chart message, it may take a few days to be addressed, specially if I am not in the office.  Please be sure medication list is accurate. If a new problem present, please set up appointment sooner than planned today.  Health Maintenance, Female Adopting a healthy lifestyle and getting preventive care are important in promoting health and wellness. Ask your health care provider about: The right schedule for you to have regular tests and exams. Things you can do on your own to prevent diseases and keep yourself healthy. What should I know about diet, weight, and exercise? Eat a healthy diet  Eat a diet that includes plenty of vegetables, fruits, low-fat dairy products, and lean protein. Do not eat a lot of foods that are high in solid fats, added sugars, or sodium. Maintain a healthy weight Body mass index (BMI) is used to identify weight problems. It estimates body fat based on height and weight. Your health care provider can help determine your BMI and help you achieve or maintain a healthy weight. Get regular exercise Get regular exercise. This is one of the most important things you can do for your health. Most adults  should: Exercise for at least 150 minutes each week. The exercise should increase your heart rate and make you sweat (moderate-intensity exercise). Do strengthening exercises at least twice a week. This is in addition to the moderate-intensity exercise. Spend less time sitting. Even light physical activity can be beneficial. Watch cholesterol and blood lipids Have your blood tested for lipids and cholesterol at 40 years of age, then have this test every 5 years. Have your cholesterol levels checked more often if: Your lipid or cholesterol levels are high. You are older than 40 years of age. You are at high risk for heart disease. What should I know about cancer screening? Depending on your health history and family history, you may need to have cancer screening at various ages. This may include screening for: Breast cancer. Cervical cancer. Colorectal cancer. Skin cancer. Lung cancer. What should I know about heart disease, diabetes, and high blood pressure? Blood pressure and heart disease High blood pressure causes heart disease and increases the risk of stroke. This is more likely to develop in people who have high blood pressure readings or are overweight. Have your blood pressure checked: Every 3-5 years if you are 4-48 years of age. Every year if you are 69 years old or older. Diabetes Have regular diabetes screenings. This checks your fasting blood sugar level. Have the screening done: Once every three years after age 27 if you are at a normal weight and have a low risk for diabetes. More often and at a younger age if you are overweight or have a high risk for diabetes. What should  I know about preventing infection? Hepatitis B If you have a higher risk for hepatitis B, you should be screened for this virus. Talk with your health care provider to find out if you are at risk for hepatitis B infection. Hepatitis C Testing is recommended for: Everyone born from [birth year] through  1963. Anyone with known risk factors for hepatitis C. Sexually transmitted infections (STIs) Get screened for STIs, including gonorrhea and chlamydia, if: You are sexually active and are younger than 40 years of age. You are older than 40 years of age and your health care provider tells you that you are at risk for this type of infection. Your sexual activity has changed since you were last screened, and you are at increased risk for chlamydia or gonorrhea. Ask your health care provider if you are at risk. Ask your health care provider about whether you are at high risk for HIV. Your health care provider may recommend a prescription medicine to help prevent HIV infection. If you choose to take medicine to prevent HIV, you should first get tested for HIV. You should then be tested every 3 months for as long as you are taking the medicine. Pregnancy If you are about to stop having your period (premenopausal) and you may become pregnant, seek counseling before you get pregnant. Take 400 to 800 micrograms (mcg) of folic acid every day if you become pregnant. Ask for birth control (contraception) if you want to prevent pregnancy. Osteoporosis and menopause Osteoporosis is a disease in which the bones lose minerals and strength with aging. This can result in bone fractures. If you are 79 years old or older, or if you are at risk for osteoporosis and fractures, ask your health care provider if you should: Be screened for bone loss. Take a calcium or vitamin D supplement to lower your risk of fractures. Be given hormone replacement therapy (HRT) to treat symptoms of menopause. Follow these instructions at home: Alcohol use Do not drink alcohol if: Your health care provider tells you not to drink. You are pregnant, may be pregnant, or are planning to become pregnant. If you drink alcohol: Limit how much you have to: 0-1 drink a day. Know how much alcohol is in your drink. In the [place] one drink  equals one 12 oz bottle of beer (355 mL), one 5 oz glass of wine (148 mL), or one 1 oz glass of hard liquor (44 mL). Lifestyle Do not use any products that contain nicotine or tobacco. These products include cigarettes, chewing tobacco, and vaping devices, such as e-cigarettes. If you need help quitting, ask your health care provider. Do not use street drugs. Do not share needles. Ask your health care provider for help if you need support or information about quitting drugs. General instructions Schedule regular health, dental, and eye exams. Stay current with your vaccines. Tell your health care provider if: You often feel depressed. You have ever been abused or do not feel safe at home. Summary Adopting a healthy lifestyle and getting preventive care are important in promoting health and wellness. Follow your health care provider's instructions about healthy diet, exercising, and getting tested or screened for diseases. Follow your health care provider's instructions on monitoring your cholesterol and blood pressure. This information is not intended to replace advice given to you by your health care provider. Make sure you discuss any questions you have with your health care provider. Document Revised: 06/28/2020 Document Reviewed: 06/28/2020 Elsevier Patient Education  2024 [employer].

## 2023-01-31 NOTE — Progress Notes (Unsigned)
HPI: Carolyn Pierce is a 42 y.o. female with a PMHx significant for cervical radiculopathy, anxiety, and tremor, who is here today for her routine physical + other concerns she would like to discuss.  Last CPE: 06/11/2020  Exercise: Patient admits she has not been exercising.  Diet: She is cooking at home, eating mainly red meat and chicken. She snacks on pretzels and apple crisp chips.  Sleep: 7-8 hours per night Alcohol Use: occasional social drinking Smoking: never  Vision: She hasn't seen an eye doctor for several years.  Dental: UTD on routine dental care.  Her next appointment with gynecology is 02/27/2023. She still has her IUD.   Immunization History  Administered Date(s) Administered   Influenza,inj,Quad PF,6+ Mos 11/24/2020   Influenza-Unspecified 10/22/2022   Tdap 11/12/2015, 10/03/2018   Health Maintenance  Topic Date Due   Cervical Cancer Screening (HPV/Pap Cotest)  Never done   COVID-19 Vaccine (1 - 2023-24 season) 02/16/2023 (Originally 10/22/2022)   DTaP/Tdap/Td (3 - Td or Tdap) 10/02/2028   INFLUENZA VACCINE  Completed   Hepatitis C Screening  Completed   HIV Screening  Completed   HPV VACCINES  Aged Out   Chronic medical problems:  HLD on non pharmacologic treatment. Lab Results  Component Value Date   CHOL 190 06/11/2020   HDL 49 (L) 06/11/2020   LDLCALC 124 (H) 06/11/2020   TRIG 73 06/11/2020   CHOLHDL 3.9 06/11/2020   Concerns today:   Fatigue: She asks about having her iron checked because she has been having some fatigue for a few months.  Denies changes in activity or stress levels.  She has a history of iron deficiency anemia.  She says she feels rested when she wakes up but gets tired as the day progresses.  Chest pain:  Patient also complains of some recent intermittent chest pain under her left breast for the last month. It woke her up last night.  She describes the pain as an achy pain, and says it lasts for a few seconds. She  rates it as a 6/10.  It is not worsened with exertion, eating, or breathing.  She denies radiation to her arms or neck, heartburn, numbness, tingling, or any other associated symptoms.  Her grandmother died from an MI in her 14s, and her mother also had an MI in her late 18s.   Review of Systems  Constitutional:  Negative for activity change, appetite change and fever.  HENT:  Negative for hearing loss, mouth sores, sore throat and trouble swallowing.   Eyes:  Negative for redness and visual disturbance.  Respiratory:  Negative for cough, shortness of breath and wheezing.   Cardiovascular:  Positive for chest pain. Negative for palpitations and leg swelling.  Gastrointestinal:  Negative for abdominal pain, nausea and vomiting.       No changes in bowel habits.  Endocrine: Negative for cold intolerance, heat intolerance, polydipsia, polyphagia and polyuria.  Genitourinary:  Negative for decreased urine volume, dysuria, hematuria, vaginal bleeding and vaginal discharge.  Musculoskeletal:  Negative for gait problem and myalgias.  Skin:  Negative for color change and rash.  Allergic/Immunologic: Negative for environmental allergies.  Neurological:  Negative for seizures, syncope, weakness and headaches.  Hematological:  Negative for adenopathy. Does not bruise/bleed easily.  Psychiatric/Behavioral:  Negative for confusion. The patient is not nervous/anxious.   All other systems reviewed and are negative.   Current Outpatient Medications on File Prior to Visit  Medication Sig Dispense Refill   levonorgestrel (MIRENA) 20  MCG/24HR IUD 1 each by Intrauterine route once.     No current facility-administered medications on file prior to visit.    Past Medical History:  Diagnosis Date   Acne    Anemia    hx/o iron deficiency anemia   Essential tremor    Dr. Terrace Arabia, neurology   History of abnormal Pap smear    History of trichomonal vaginitis    History of UTI    Menorrhagia    prior  trials of Depo Provera, Ortho Tri Cyclen Lo   Yeast vaginitis    recurrent    Past Surgical History:  Procedure Laterality Date   APPENDECTOMY     BREAST SURGERY     COSMETIC SURGERY     DILATION AND EVACUATION     LAPAROSCOPIC UNILATERAL SALPINGO OOPHERECTOMY     LAPAROSCOPY N/A 10/04/2013   Procedure: LAPAROSCOPY OPERATIVE, Removal of Right Fallopian Tube and Ovary;  Surgeon: Catalina Antigua, MD;  Location: [facility];  Service: Gynecology;  Laterality: N/A;   WISDOM TOOTH EXTRACTION      No Known Allergies  Family History  Problem Relation Age of Onset   Stroke Mother    Alcohol abuse Mother    Diabetes Mother    Drug abuse Mother    Hyperlipidemia Mother    Hypertension Mother    Alcohol abuse Father    Drug abuse Father    Hyperlipidemia Father    Hypertension Father    Cancer Son    Stroke Paternal Uncle    Early death Maternal Grandmother    Heart attack Maternal Grandmother     Social History   Socioeconomic History   Marital status: Single    Spouse name: Not on file   Number of children: 2   Years of education: Not on file   Highest education level: Bachelor's degree (e.g., BA, AB, BS)  Occupational History   Not on file  Tobacco Use   Smoking status: Never   Smokeless tobacco: Never  Vaping Use   Vaping status: Never Used  Substance and Sexual Activity   Alcohol use: Not on file    Comment: occ   Drug use: No   Sexual activity: Yes    Birth control/protection: I.U.D.  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  Wt Readings from Last 3 Encounters:  01/31/23 150 lb 4 oz (68.2 kg)  11/24/20 145 lb (65.8 kg)  06/11/20 137 lb 9.6 oz (62.4 kg)   Physical Exam Vitals and nursing note reviewed.  Constitutional:      General: She is not in acute distress.    Appearance: She is well-developed.  HENT:     Head: Normocephalic and atraumatic.     Right Ear: Hearing, tympanic membrane, ear canal and external ear normal.     Left Ear: Hearing, tympanic membrane, ear canal and external ear normal.     Mouth/Throat:  Mouth: Mucous membranes are moist.     Pharynx: Oropharynx is clear. Uvula midline.  Eyes:     Extraocular Movements: Extraocular movements intact.     Conjunctiva/sclera: Conjunctivae normal.     Pupils: Pupils are equal, round, and reactive to light.  Neck:     Thyroid: No thyromegaly.     Trachea: No tracheal deviation.  Cardiovascular:     Rate and Rhythm: Normal rate and regular rhythm.     Pulses:          Dorsalis pedis pulses are 2+ on the right side and 2+ on the left side.     Heart sounds: No murmur heard. Pulmonary:     Effort: Pulmonary effort is normal. No respiratory distress.     Breath sounds: Normal breath sounds.  Chest:     Chest wall: No tenderness.  Abdominal:     Palpations: Abdomen is soft. There is no hepatomegaly or mass.     Tenderness: There is no abdominal tenderness.  Genitourinary:    Comments: Deferred to gyn. Musculoskeletal:     Right lower leg: No edema.     Left lower leg: No edema.     Comments: No major deformity or signs of synovitis appreciated.  Lymphadenopathy:     Cervical: No cervical adenopathy.     Upper Body:     Right upper body: No supraclavicular adenopathy.     Left upper body: No supraclavicular adenopathy.  Skin:    General: Skin is warm.     Findings: No erythema or rash.  Neurological:     General: No focal deficit present.     Mental Status: She is alert and  oriented to person, place, and time.     Cranial Nerves: No cranial nerve deficit.     Sensory: No sensory deficit.     Motor: No weakness.     Coordination: Coordination normal.     Gait: Gait normal.     Deep Tendon Reflexes:     Reflex Scores:      Bicep reflexes are 2+ on the right side and 2+ on the left side.      Patellar reflexes are 2+ on the right side and 2+ on the left side. Psychiatric:     Comments: Well groomed, good eye contact.    ASSESSMENT AND PLAN:  Carolyn Pierce was here today for her annual physical examination.  Orders Placed This Encounter  Procedures   DG Chest 2 View   CBC   Iron   Ferritin   TSH   Comprehensive metabolic panel   Lipid panel   VITAMIN D 25 Hydroxy (Vit-D Deficiency, Fractures)   Ambulatory referral to Cardiology   EKG 12-Lead    @ASSESSPLAN @  Routine general medical examination at a health care facility Assessment & Plan: We discussed the importance of regular physical activity and healthy diet for prevention of chronic illness and/or complications. Preventive guidelines reviewed. Vaccination up to date. Continue her female preventive care with her gyn, appt 02/2023. Next CPE in a year.   Screening for endocrine, metabolic and immunity disorder -     Comprehensive metabolic panel; Future  Dyslipidemia (high LDL; low HDL) Assessment & Plan: Non pharmacologic treatment recommended for now. Further recommendations will be given according to 10 years CVD risk score and lipid panel numbers.  Orders: -     Lipid panel; Future  Iron deficiency anemia, unspecified iron deficiency anemia type -     CBC;  Future -     Iron; Future -     Ferritin; Future  Other fatigue -     CBC; Future -     TSH; Future -     Comprehensive metabolic panel; Future  Other chest pain -     EKG 12-Lead -     DG Chest 2 View; Future -     Ambulatory referral to Cardiology  Patient request for diagnostic testing -     VITAMIN D  25 Hydroxy (Vit-D Deficiency, Fractures); Future  Return in 1 year (on 01/31/2024) for CPE.  I, Carolyn Pierce, acting as a scribe for Carolyn Vey [place], MD., have documented all relevant documentation on the behalf of Carolyn Cadle [place], MD, as directed by  Carolyn Marzan [place], MD while in the presence of Carolyn Warwick [place], MD.   I, Carolyn Hylton [place], MD, have reviewed all documentation for this visit. The documentation on 01/31/23 for the exam, diagnosis, procedures, and orders are all accurate and complete.  Carolyn Kothari G. [place], MD  [facility]. [place] office.

## 2023-02-05 DIAGNOSIS — R87610 Atypical squamous cells of undetermined significance on cytologic smear of cervix (ASC-US): Secondary | ICD-10-CM | POA: Diagnosis not present

## 2023-02-05 DIAGNOSIS — Z01419 Encounter for gynecological examination (general) (routine) without abnormal findings: Secondary | ICD-10-CM | POA: Diagnosis not present

## 2023-02-05 DIAGNOSIS — Z124 Encounter for screening for malignant neoplasm of cervix: Secondary | ICD-10-CM | POA: Diagnosis not present

## 2023-02-05 DIAGNOSIS — N644 Mastodynia: Secondary | ICD-10-CM | POA: Diagnosis not present

## 2023-02-05 DIAGNOSIS — N852 Hypertrophy of uterus: Secondary | ICD-10-CM | POA: Diagnosis not present

## 2023-02-05 DIAGNOSIS — T8332XA Displacement of intrauterine contraceptive device, initial encounter: Secondary | ICD-10-CM | POA: Diagnosis not present

## 2023-02-13 DIAGNOSIS — Z803 Family history of malignant neoplasm of breast: Secondary | ICD-10-CM | POA: Diagnosis not present

## 2023-02-13 DIAGNOSIS — N644 Mastodynia: Secondary | ICD-10-CM | POA: Diagnosis not present

## 2023-02-13 DIAGNOSIS — N6002 Solitary cyst of left breast: Secondary | ICD-10-CM | POA: Diagnosis not present

## 2023-02-13 DIAGNOSIS — N6323 Unspecified lump in the left breast, lower outer quadrant: Secondary | ICD-10-CM | POA: Diagnosis not present

## 2023-03-06 DIAGNOSIS — T8332XA Displacement of intrauterine contraceptive device, initial encounter: Secondary | ICD-10-CM | POA: Diagnosis not present

## 2023-03-06 DIAGNOSIS — T8332XD Displacement of intrauterine contraceptive device, subsequent encounter: Secondary | ICD-10-CM | POA: Diagnosis not present

## 2023-03-06 DIAGNOSIS — N852 Hypertrophy of uterus: Secondary | ICD-10-CM | POA: Diagnosis not present

## 2023-04-10 DIAGNOSIS — T8332XA Displacement of intrauterine contraceptive device, initial encounter: Secondary | ICD-10-CM | POA: Diagnosis not present

## 2023-04-10 DIAGNOSIS — R87810 Cervical high risk human papillomavirus (HPV) DNA test positive: Secondary | ICD-10-CM | POA: Diagnosis not present

## 2023-04-10 DIAGNOSIS — N72 Inflammatory disease of cervix uteri: Secondary | ICD-10-CM | POA: Diagnosis not present

## 2023-04-10 DIAGNOSIS — Z3202 Encounter for pregnancy test, result negative: Secondary | ICD-10-CM | POA: Diagnosis not present

## 2023-04-10 DIAGNOSIS — R87610 Atypical squamous cells of undetermined significance on cytologic smear of cervix (ASC-US): Secondary | ICD-10-CM | POA: Diagnosis not present

## 2023-09-07 ENCOUNTER — Encounter: Payer: Self-pay | Admitting: Advanced Practice Midwife

## 2023-11-16 ENCOUNTER — Emergency Department (HOSPITAL_BASED_OUTPATIENT_CLINIC_OR_DEPARTMENT_OTHER): Admitting: Radiology

## 2023-11-16 ENCOUNTER — Emergency Department (HOSPITAL_BASED_OUTPATIENT_CLINIC_OR_DEPARTMENT_OTHER)
Admission: EM | Admit: 2023-11-16 | Discharge: 2023-11-16 | Disposition: A | Discharge status: Home / Self Care | Attending: Emergency Medicine | Admitting: Emergency Medicine

## 2023-11-16 DIAGNOSIS — M25512 Pain in left shoulder: Secondary | ICD-10-CM | POA: Insufficient documentation

## 2023-11-16 DIAGNOSIS — M542 Cervicalgia: Secondary | ICD-10-CM | POA: Diagnosis present

## 2023-11-16 DIAGNOSIS — S161XXA Strain of muscle, fascia and tendon at neck level, initial encounter: Secondary | ICD-10-CM | POA: Insufficient documentation

## 2023-11-16 DIAGNOSIS — R0789 Other chest pain: Secondary | ICD-10-CM | POA: Diagnosis not present

## 2023-11-16 DIAGNOSIS — Z041 Encounter for examination and observation following transport accident: Secondary | ICD-10-CM | POA: Diagnosis not present

## 2023-11-16 DIAGNOSIS — Y92410 Unspecified street and highway as the place of occurrence of the external cause: Secondary | ICD-10-CM | POA: Insufficient documentation

## 2023-11-16 MED ORDER — IBUPROFEN 800 MG PO TABS
800.0000 mg | ORAL_TABLET | Freq: Once | ORAL | Status: AC
  Administered 2023-11-16: 800 mg via ORAL
  Filled 2023-11-16: qty 1
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
## 2023-11-16 NOTE — ED Notes (Signed)
Discharge instructions, follow up care, and pain management reviewed and explained, pt verbalized understanding and had no further questions on d/c.  
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
## 2024-05-23 ENCOUNTER — Encounter: Admitting: Obstetrics and Gynecology
